# Patient Record
Sex: Female | Born: 1975 | Race: White | Hispanic: No | Marital: Married | Smoking: Never smoker
Health system: Southern US, Community
[De-identification: ages and names within clinical notes are randomized; demographics above are authoritative.]

## PROBLEM LIST (undated history)

## (undated) DIAGNOSIS — C801 Malignant (primary) neoplasm, unspecified: Secondary | ICD-10-CM

## (undated) DIAGNOSIS — Z87898 Personal history of other specified conditions: Secondary | ICD-10-CM

## (undated) DIAGNOSIS — N838 Other noninflammatory disorders of ovary, fallopian tube and broad ligament: Secondary | ICD-10-CM

## (undated) DIAGNOSIS — I7300 Raynaud's syndrome without gangrene: Secondary | ICD-10-CM

## (undated) DIAGNOSIS — Z862 Personal history of diseases of the blood and blood-forming organs and certain disorders involving the immune mechanism: Secondary | ICD-10-CM

## (undated) DIAGNOSIS — Z975 Presence of (intrauterine) contraceptive device: Secondary | ICD-10-CM

## (undated) DIAGNOSIS — R569 Unspecified convulsions: Secondary | ICD-10-CM

## (undated) DIAGNOSIS — R001 Bradycardia, unspecified: Secondary | ICD-10-CM

## (undated) DIAGNOSIS — Z8619 Personal history of other infectious and parasitic diseases: Secondary | ICD-10-CM

## (undated) DIAGNOSIS — I493 Ventricular premature depolarization: Secondary | ICD-10-CM

## (undated) DIAGNOSIS — D649 Anemia, unspecified: Secondary | ICD-10-CM

## (undated) HISTORY — DX: Unspecified convulsions: R56.9

## (undated) HISTORY — DX: Personal history of other infectious and parasitic diseases: Z86.19

## (undated) HISTORY — DX: Personal history of other specified conditions: Z87.898

## (undated) HISTORY — PX: APPENDECTOMY: SHX54

## (undated) HISTORY — PX: ABDOMINAL HYSTERECTOMY: SHX81

## (undated) HISTORY — DX: Malignant (primary) neoplasm, unspecified: C80.1

## (undated) HISTORY — DX: Anemia, unspecified: D64.9

---

## 2005-10-31 HISTORY — PX: COLPOSCOPY: SHX161

## 2013-04-03 LAB — HM PAP SMEAR: HM Pap smear: NEGATIVE

## 2019-04-01 ENCOUNTER — Other Ambulatory Visit: Payer: Self-pay

## 2019-04-01 ENCOUNTER — Encounter: Payer: Self-pay | Admitting: Family Medicine

## 2019-04-01 ENCOUNTER — Ambulatory Visit (INDEPENDENT_AMBULATORY_CARE_PROVIDER_SITE_OTHER): Payer: Managed Care, Other (non HMO) | Admitting: Family Medicine

## 2019-04-01 VITALS — BP 112/72 | HR 50 | Temp 98.9°F | Resp 12 | Ht 65.75 in | Wt 130.5 lb

## 2019-04-01 DIAGNOSIS — S46911A Strain of unspecified muscle, fascia and tendon at shoulder and upper arm level, right arm, initial encounter: Secondary | ICD-10-CM | POA: Insufficient documentation

## 2019-04-01 DIAGNOSIS — Z975 Presence of (intrauterine) contraceptive device: Secondary | ICD-10-CM | POA: Diagnosis not present

## 2019-04-01 DIAGNOSIS — L309 Dermatitis, unspecified: Secondary | ICD-10-CM

## 2019-04-01 NOTE — Progress Notes (Signed)
Subjective:     Kelly Woods is a 43 y.o. female presenting for Establish Care (previous PCP with [place] office-LOV was around 2015) and Rash (symptoms present since the end of last week-03/25/2019 pain in the upper back/shoulder area and pain and rash present in the right arm.)     HPI   #Rash - noticed a rash initially on the lower back in December - resolved on its own and never saw anyone - started with numbness and then rash - last week started having pain behind the shoulder on the right side - thought initially stress on the back - then a couple of days later the pain started shooting down the arm and was getting rashes - was getting an itchy lesions - still having posterior shoulder pain - cannot figure out anything that makes the pain worse - improves with stretching - First lesion on 03/28/2019 and then noticed more over the weekend - has used hydrocortisone cream on the rash - has used cooling towel which helps   #colposcopy - in 2008 - has had normal paps since then   Review of Systems  Constitutional: Negative for chills and fever.  Musculoskeletal:       Shoulder pain  Neurological: Negative for numbness.     Social History   Tobacco Use  Smoking Status Never Smoker  Smokeless Tobacco Never Used        Objective:    BP Readings from Last 3 Encounters:  04/01/19 112/72   Wt Readings from Last 3 Encounters:  04/01/19 130 lb 8 oz (59.2 kg)    BP 112/72   Pulse (!) 50   Temp 98.9 F (37.2 C)   Resp 12   Ht 5' 5.75" (1.67 m)   Wt 130 lb 8 oz (59.2 kg)   SpO2 98%   BMI 21.22 kg/m    Physical Exam Constitutional:      General: She is not in acute distress.    Appearance: Normal appearance. She is well-developed. She is not ill-appearing or diaphoretic.  HENT:     Head: Normocephalic and atraumatic.     Right Ear: External ear normal.     Left Ear: External ear normal.     Nose: Nose normal.  Eyes:     Conjunctiva/sclera:  Conjunctivae normal.  Neck:     Musculoskeletal: Neck supple.  Cardiovascular:     Rate and Rhythm: Regular rhythm. Bradycardia present.     Heart sounds: No murmur.  Pulmonary:     Effort: Pulmonary effort is normal. No respiratory distress.     Breath sounds: Normal breath sounds. No wheezing.  Musculoskeletal:     Comments: Right shoulder:  Inspection: no rashes, lesions Palpation: no TTP, though pt notes discomfort is located lateral to the shoulder blade ROM: Normal Strength: normal with normal rotator cuff testing  Skin:    General: Skin is warm and dry.     Capillary Refill: Capillary refill takes less than 2 seconds.     Comments: Right UE with 3 scattered pustular lesions w/o active draining  Neurological:     Mental Status: She is alert. Mental status is at baseline.  Psychiatric:        Mood and Affect: Mood normal.        Behavior: Behavior normal.           Assessment & Plan:   Problem List Items Addressed This Visit      Musculoskeletal and Integument   Strain  of right shoulder - Primary    Home stretching routine, if not improved - can do PT referral      Dermatitis    Suspect contact/allergic. Advised hydrocortisone cream.         Other   IUD (intrauterine device) in place    Placed ~5 years ago. Mirena          Return if symptoms worsen or fail to improve.  Lynnda ChildJessica R , MD

## 2019-04-01 NOTE — Assessment & Plan Note (Signed)
Home stretching routine, if not improved - can do PT referral

## 2019-04-01 NOTE — Patient Instructions (Addendum)
#  Shoulder pain - would recommend doing stretches - if worsening or not improving, let me know   # Rash - looks like a dermatitis (like poison ivy) - Calamine lotion or hydrocortisone as needed - if worsening or not improving over the next 1-2 weeks let me know  Blood work - try to bring a copy of your blood work to your next visit so we can have it on file  #Mirena  - We will need to order the IUD, and someone will call you to schedule as soon as the IUD arrives.

## 2019-04-01 NOTE — Assessment & Plan Note (Signed)
Suspect contact/allergic. Advised hydrocortisone cream.

## 2019-04-01 NOTE — Assessment & Plan Note (Signed)
Placed ~5 years ago. Mirena

## 2019-04-24 ENCOUNTER — Telehealth: Payer: Self-pay | Admitting: Family Medicine

## 2019-04-24 DIAGNOSIS — Z30433 Encounter for removal and reinsertion of intrauterine contraceptive device: Secondary | ICD-10-CM

## 2019-04-24 NOTE — Addendum Note (Signed)
Addended by: Lesleigh Noe on: 04/24/2019 02:47 PM   Modules accepted: Orders

## 2019-04-24 NOTE — Telephone Encounter (Signed)
At this point we are unfortunately waiting for some supplies to be able to do the procedure.   I'm not sure how long that will take.   I can place a referral to OB/GYN so that she can move forward with the procedure since I know it is time for her replacement.   Lesleigh Noe

## 2019-04-24 NOTE — Telephone Encounter (Signed)
Sending to Dr. Einar Pheasant for review

## 2019-04-24 NOTE — Telephone Encounter (Signed)
Referral placed due to delay obtaining supplies to be able to do the procedure in office.

## 2019-04-24 NOTE — Telephone Encounter (Signed)
Patient advised. Patient would like to proceed with the referral to OBGYN. She would like to go to the Center for [facility] in [place] or [place] location.

## 2019-04-24 NOTE — Telephone Encounter (Signed)
Patient called today to check status of Mirena order. She called to see if this has arrived and to schedule an appointment for this to be placed.    Patient's C/B # - [phone]

## 2019-05-01 ENCOUNTER — Encounter: Payer: Self-pay | Admitting: Advanced Practice Midwife

## 2019-05-01 ENCOUNTER — Other Ambulatory Visit: Payer: Self-pay

## 2019-05-01 ENCOUNTER — Ambulatory Visit (INDEPENDENT_AMBULATORY_CARE_PROVIDER_SITE_OTHER): Payer: Managed Care, Other (non HMO) | Admitting: Advanced Practice Midwife

## 2019-05-01 VITALS — BP 113/71 | HR 71 | Wt 131.4 lb

## 2019-05-01 DIAGNOSIS — Z30433 Encounter for removal and reinsertion of intrauterine contraceptive device: Secondary | ICD-10-CM

## 2019-05-01 DIAGNOSIS — Z124 Encounter for screening for malignant neoplasm of cervix: Secondary | ICD-10-CM

## 2019-05-01 DIAGNOSIS — Z3202 Encounter for pregnancy test, result negative: Secondary | ICD-10-CM

## 2019-05-01 DIAGNOSIS — Z01419 Encounter for gynecological examination (general) (routine) without abnormal findings: Secondary | ICD-10-CM

## 2019-05-01 DIAGNOSIS — Z1151 Encounter for screening for human papillomavirus (HPV): Secondary | ICD-10-CM | POA: Diagnosis not present

## 2019-05-01 DIAGNOSIS — Z975 Presence of (intrauterine) contraceptive device: Secondary | ICD-10-CM

## 2019-05-01 LAB — POCT URINE PREGNANCY: Preg Test, Ur: NEGATIVE

## 2019-05-01 MED ORDER — LEVONORGESTREL 20 MCG/24HR IU IUD
INTRAUTERINE_SYSTEM | Freq: Once | INTRAUTERINE | Status: AC
  Administered 2019-05-01: 10:00:00 via INTRAUTERINE

## 2019-05-01 NOTE — Progress Notes (Signed)
GYNECOLOGY ANNUAL PREVENTATIVE CARE ENCOUNTER NOTE  History:     Kelly Woods is a 43 y.o.  female here for a routine annual gynecologic exam.  Current complaints: none.   Denies abnormal vaginal bleeding, discharge, pelvic pain, problems with intercourse or other gynecologic concerns.    Patient is satisfied with her IUD and requests removal and replacement. She lives with her husband and two children. She is a middle distance runner, is a non-smoker, denies thoughts of self-harm, harm to others. Denies concern for intimate partner violence.   Gynecologic History No LMP recorded. (Menstrual status: IUD). Contraception: IUD Last Pap: 03/2013. Results were: normal with negative HPV Last mammogram: N/A age 10 on 05/10/18.   Obstetric History OB History  No obstetric history on file.    Past Medical History:  Diagnosis Date   History of chicken pox    History of seizure    as a child    Past Surgical History:  Procedure Laterality Date   COLPOSCOPY  2007    Current Outpatient Medications on File Prior to Visit  Medication Sig Dispense Refill   Levonorgestrel (MIRENA, 52 MG, IU) by Intrauterine route.     Multiple Vitamins-Minerals (MULTIVITAMIN ADULT PO) Take by mouth.     No current facility-administered medications on file prior to visit.     No Known Allergies  Social History:  reports that she has never smoked. She has never used smokeless tobacco. She reports current alcohol use. She reports that she does not use drugs.  Family History  Problem Relation Age of Onset   Psoriasis Mother    Sleep apnea Mother    Hyperlipidemia Father    Heart disease Maternal Grandfather        by-pass surgery   Diabetes Paternal Aunt    Breast cancer Paternal Aunt     The following portions of the patient's history were reviewed and updated as appropriate: allergies, current medications, past family history, past medical history, past social history, past  surgical history and problem list.  Review of Systems Pertinent items noted in HPI and remainder of comprehensive ROS otherwise negative.  Physical Exam:  BP 113/71    Pulse 71    Wt 131 lb 6.4 oz (59.6 kg)    BMI 21.37 kg/m  CONSTITUTIONAL: Well-developed, well-nourished female in no acute distress.  HENT:  Normocephalic, atraumatic, External right and left ear normal. Oropharynx is clear and moist EYES: Conjunctivae and EOM are normal. Pupils are equal, round, and reactive to light. No scleral icterus.  NECK: Normal range of motion, supple, no masses.  Normal thyroid.  SKIN: Skin is warm and dry. No rash noted. Not diaphoretic. No erythema. No pallor. MUSCULOSKELETAL: Normal range of motion. No tenderness.  No cyanosis, clubbing, or edema.  2+ distal pulses. NEUROLOGIC: Alert and oriented to person, place, and time. Normal reflexes, muscle tone coordination. No cranial nerve deficit noted. PSYCHIATRIC: Normal mood and affect. Normal behavior. Normal judgment and thought content. CARDIOVASCULAR: Normal heart rate noted, regular rhythm RESPIRATORY: Clear to auscultation bilaterally. Effort and breath sounds normal, no problems with respiration noted. BREASTS: Symmetric in size. No masses, skin changes, nipple drainage, or lymphadenopathy. ABDOMEN: Soft, normal bowel sounds, no distention noted.  No tenderness, rebound or guarding.  PELVIC: Normal appearing external genitalia; normal appearing vaginal mucosa and cervix.  No abnormal discharge noted.  Pap smear obtained.  Normal uterine size, no other palpable masses, no uterine or adnexal tenderness.    IUD Removal and Reinsertion  Patient identified, informed consent performed, consent signed.   Discussed risks of irregular bleeding, cramping, infection, malpositioning or misplacement of the IUD outside the uterus which may require further procedures. Also advised to use backup contraception for one week as the risk of pregnancy is higher  during the transition period of removing an IUD and replacing it with another one. Time out was performed. Speculum placed in the vagina. The strings of the IUD were grasped and pulled using ring forceps. The IUD was successfully removed in its entirety. The cervix was cleaned with Betadine x 2 and grasped anteriorly with a single tooth tenaculum.  The new Mirena IUD insertion apparatus was used to sound the uterus to 7 cm;  the IUD was then placed per manufacturer's recommendations. Strings trimmed to 3 cm. Tenaculum was removed, good hemostasis noted. Patient tolerated procedure well.   Patient was given post-procedure instructions.  She was reminded to have backup contraception for one week during this transition period between IUDs.  Patient was also asked to check IUD strings periodically and follow up in 4 weeks for IUD check.  Assessment and Plan:    1. Encounter for IUD removal and reinsertion - Tolerated well by patient. Aftercare instructions reviewed - POCT urine pregnancy  2. Well woman exam with routine gynecological exam - No concerning findings on physical exam - Lab work accomplished through employer, followed by PCP, not ordered today - Cytology - PAP( ) - MM DIAG BREAST TOMO BILATERAL; Future  Will follow up results of pap smear and manage accordingly. Mammogram ordered Routine preventative health maintenance measures emphasized. Please refer to After Visit Summary for other counseling recommendations.      Return in about 4 weeks (around 05/29/2019) for string check.  Total visit time 30 minutes. 50% of total visit time spent in counseling and coordination of care  Clayton BiblesSamantha  Certified Nurse Midwife [facility] [place] [facility]

## 2019-05-01 NOTE — Progress Notes (Signed)
Needs Mammogram

## 2019-05-01 NOTE — Patient Instructions (Signed)

## 2019-05-06 LAB — CYTOLOGY - PAP
Diagnosis: NEGATIVE
HPV: NOT DETECTED

## 2019-06-05 ENCOUNTER — Other Ambulatory Visit: Payer: Self-pay

## 2019-06-05 ENCOUNTER — Ambulatory Visit (INDEPENDENT_AMBULATORY_CARE_PROVIDER_SITE_OTHER): Payer: Managed Care, Other (non HMO) | Admitting: Advanced Practice Midwife

## 2019-06-05 ENCOUNTER — Encounter: Payer: Self-pay | Admitting: Advanced Practice Midwife

## 2019-06-05 VITALS — BP 107/69 | HR 54

## 2019-06-05 DIAGNOSIS — Z975 Presence of (intrauterine) contraceptive device: Secondary | ICD-10-CM

## 2019-06-05 DIAGNOSIS — Z30431 Encounter for routine checking of intrauterine contraceptive device: Secondary | ICD-10-CM

## 2019-06-05 NOTE — Progress Notes (Signed)
    GYNECOLOGY OFFICE ENCOUNTER NOTE  History:  43 y.o. [id number] here today for today for IUD string check; Mirena  IUD was placed 05/01/19. No complaints about the IUD, no concerning side effects.  The following portions of the patient's history were reviewed and updated as appropriate: allergies, current medications, past family history, past medical history, past social history, past surgical history and problem list. Last pap smear on 05/01/19 was normal, negative HRHPV.  Review of Systems:  Pertinent items are noted in HPI.   Objective:  Physical Exam Blood pressure 107/69, pulse (!) 54. CONSTITUTIONAL: Well-developed, well-nourished female in no acute distress.  HENT:  Normocephalic, atraumatic. External right and left ear normal. Oropharynx is clear and moist EYES: Conjunctivae and EOM are normal. Pupils are equal, round, and reactive to light. No scleral icterus.  NECK: Normal range of motion, supple, no masses CARDIOVASCULAR: Normal heart rate noted RESPIRATORY: Effort and breath sounds normal, no problems with respiration noted ABDOMEN: Soft, no distention noted.   PELVIC: Normal appearing external genitalia; normal appearing vaginal mucosa and cervix.  IUD strings visualized, about 3 cm in length outside cervix.   Assessment & Plan:  Patient to keep IUD in place for up to five years; can come in for removal if she desires pregnancy earlier or for any concerning side effects.  Total visit time 15 minutes. Greater than 50% of visit spent in counseling and coordination of care  Mallie Snooks, MSN, CNM Certified Nurse Midwife, [employer] for [employer], [place]

## 2019-06-10 ENCOUNTER — Other Ambulatory Visit: Payer: Self-pay | Admitting: Advanced Practice Midwife

## 2019-06-10 ENCOUNTER — Ambulatory Visit
Admission: RE | Admit: 2019-06-10 | Discharge: 2019-06-10 | Disposition: A | Discharge status: Home / Self Care | Payer: Managed Care, Other (non HMO) | Source: Ambulatory Visit | Attending: Advanced Practice Midwife | Admitting: Advanced Practice Midwife

## 2019-06-10 ENCOUNTER — Other Ambulatory Visit: Payer: Self-pay

## 2019-06-10 DIAGNOSIS — Z1231 Encounter for screening mammogram for malignant neoplasm of breast: Secondary | ICD-10-CM | POA: Insufficient documentation

## 2019-06-10 DIAGNOSIS — R928 Other abnormal and inconclusive findings on diagnostic imaging of breast: Secondary | ICD-10-CM

## 2019-06-10 DIAGNOSIS — N6489 Other specified disorders of breast: Secondary | ICD-10-CM

## 2019-06-10 DIAGNOSIS — Z01419 Encounter for gynecological examination (general) (routine) without abnormal findings: Secondary | ICD-10-CM

## 2019-06-21 ENCOUNTER — Ambulatory Visit
Admission: RE | Admit: 2019-06-21 | Discharge: 2019-06-21 | Disposition: A | Discharge status: Home / Self Care | Payer: Managed Care, Other (non HMO) | Source: Ambulatory Visit | Attending: Advanced Practice Midwife | Admitting: Advanced Practice Midwife

## 2019-06-21 DIAGNOSIS — N6489 Other specified disorders of breast: Secondary | ICD-10-CM | POA: Diagnosis present

## 2019-06-21 DIAGNOSIS — R928 Other abnormal and inconclusive findings on diagnostic imaging of breast: Secondary | ICD-10-CM

## 2019-11-05 ENCOUNTER — Encounter: Payer: Self-pay | Admitting: Internal Medicine

## 2019-11-05 ENCOUNTER — Ambulatory Visit: Payer: Managed Care, Other (non HMO) | Admitting: Internal Medicine

## 2019-11-05 ENCOUNTER — Other Ambulatory Visit: Payer: Self-pay

## 2019-11-05 VITALS — BP 110/70 | HR 67 | Temp 97.9°F | Wt 132.0 lb

## 2019-11-05 DIAGNOSIS — H9202 Otalgia, left ear: Secondary | ICD-10-CM

## 2019-11-05 NOTE — Patient Instructions (Signed)

## 2019-11-05 NOTE — Progress Notes (Signed)
Subjective:    Patient ID: Kelly Woods, female    DOB: 04-Nov-1975, 44 y.o.   MRN: [id number]  HPI  Pt presents to the clinic today with c/o left ear pain. This started 2 months ago. It is intermittent. She also has a slight aching in her left ear but denies ear fullness, discharge or loss of hearing. She denies headaches, dizziness, runny nose, nasal congestion, ear pain, sore throat or cough. She denies any trauma to the area. She has not tried any medication OTC.  Review of Systems  Past Medical History:  Diagnosis Date  . History of chicken pox   . History of seizure    as a child    Current Outpatient Medications  Medication Sig Dispense Refill  . Levonorgestrel (MIRENA, 52 MG, IU) by Intrauterine route.    . Multiple Vitamins-Minerals (MULTIVITAMIN ADULT PO) Take by mouth.     No current facility-administered medications for this visit.    No Known Allergies  Family History  Problem Relation Age of Onset  . Psoriasis Mother   . Sleep apnea Mother   . Hyperlipidemia Father   . Heart disease Maternal Grandfather        by-pass surgery  . Diabetes Paternal Aunt   . Breast cancer Paternal Aunt        78's?    Social History   Socioeconomic History  . Marital status: Married    Spouse name: Jonne Ply)  . Number of children: 2  . Years of education: Bachelors degree  . Highest education level: Not on file  Occupational History  . Not on file  Tobacco Use  . Smoking status: Never Smoker  . Smokeless tobacco: Never Used  Substance and Sexual Activity  . Alcohol use: Yes    Comment: 2 drinks a week or so  . Drug use: Never  . Sexual activity: Yes    Birth control/protection: I.U.D.    Comment: IUD - about 5 years ago  Other Topics Concern  . Not on file  Social History Narrative   04/01/19   From: [place] originally    Living: with husband Jeneen Rinks and 2 kids   Work: [employer]      Family: 2 kids - Actor and Ava      Enjoys: running - distance up  to 1/2 marathon, hiking, geocashing       Exercise: running   Diet: cooks at home, reasonably well - overall doing well      Safety   Seat belts: Yes    Guns: No   Safe in relationships: Yes    Social Determinants of Radio broadcast assistant Strain: Low Risk   . Difficulty of Paying Living Expenses: Not hard at all  Food Insecurity:   . Worried About Charity fundraiser in the Last Year: Not on file  . Ran Out of Food in the Last Year: Not on file  Transportation Needs:   . Lack of Transportation (Medical): Not on file  . Lack of Transportation (Non-Medical): Not on file  Physical Activity:   . Days of Exercise per Week: Not on file  . Minutes of Exercise per Session: Not on file  Stress:   . Feeling of Stress : Not on file  Social Connections:   . Frequency of Communication with Friends and Family: Not on file  . Frequency of Social Gatherings with Friends and Family: Not on file  . Attends Religious Services: Not on file  .  Active Member of Clubs or Organizations: Not on file  . Attends Banker Meetings: Not on file  . Marital Status: Not on file  Intimate Partner Violence:   . Fear of Current or Ex-Partner: Not on file  . Emotionally Abused: Not on file  . Physically Abused: Not on file  . Sexually Abused: Not on file     Constitutional: Denies fever, malaise, fatigue, headache or abrupt weight changes.  HEENT: Pt reports left ear pain. Denies eye pain, eye redness, ringing in the ears, wax buildup, runny nose, nasal congestion, bloody nose, or sore throat. Respiratory: Denies difficulty breathing, shortness of breath, cough or sputum production.   Cardiovascular: Denies chest pain, chest tightness, palpitations or swelling in the hands or feet.   No other specific complaints in a complete review of systems (except as listed in HPI above).     Objective:   Physical Exam BP 110/70   Pulse 67   Temp 97.9 F (36.6 C) (Temporal)   Wt 132 lb (59.9  kg)   SpO2 98%   BMI 21.47 kg/m   Wt Readings from Last 3 Encounters:  05/01/19 131 lb 6.4 oz (59.6 kg)  04/01/19 130 lb 8 oz (59.2 kg)    General: Appears her stated age, well developed, well nourished in NAD. HEENT: Head: normal shape and size; Ears: Tm's gray and intact, normal light reflex;  Neck:  Neck supple, trachea midline. No masses, lumps or thyromegaly present.  Cardiovascular: Normal rate and rhythm. S1,S2 noted.  No murmur, rubs or gallops noted.  Pulmonary/Chest: Normal effort and positive vesicular breath sounds. No respiratory distress. No wheezes, rales or ronchi noted.  Neurological: Alert and oriented.         Assessment & Plan:   Otalgia, Left:  Exam benign Can try Flonase 1 spray BID x 7 days If no improvement, follow up with PCP Consider ENT referral if symptoms persist  Return precautions discussed Nicki Reaper, NP This visit occurred during the SARS-CoV-2 public health emergency.  Safety protocols were in place, including screening questions prior to the visit, additional usage of staff PPE, and extensive cleaning of exam room while observing appropriate contact time as indicated for disinfecting solutions.

## 2020-01-24 ENCOUNTER — Ambulatory Visit: Payer: Managed Care, Other (non HMO) | Attending: Internal Medicine

## 2020-01-24 DIAGNOSIS — Z23 Encounter for immunization: Secondary | ICD-10-CM

## 2020-01-24 NOTE — Progress Notes (Signed)
   Covid-19 Vaccination Clinic  Name:  Kelly Woods    MRN: [id number] DOB: 01-25-1976  01/24/2020  [name] was observed post Covid-19 immunization for 15 minutes without incident. She was provided with Vaccine Information Sheet and instruction to access the V-Safe system.   [name] was instructed to call 911 with any severe reactions post vaccine: Marland Kitchen Difficulty breathing  . Swelling of face and throat  . A fast heartbeat  . A bad rash all over body  . Dizziness and weakness   Immunizations Administered    Name Date Dose VIS Date Route   Moderna COVID-19 Vaccine 01/24/2020  9:31 AM 0.5 mL 10/01/2019 Intramuscular   Manufacturer: Moderna   Lot: [id number]   NDC: 24097-353-29

## 2020-02-26 ENCOUNTER — Ambulatory Visit: Payer: Managed Care, Other (non HMO) | Attending: Internal Medicine

## 2020-02-26 DIAGNOSIS — Z23 Encounter for immunization: Secondary | ICD-10-CM

## 2020-02-26 NOTE — Progress Notes (Signed)
   Covid-19 Vaccination Clinic  Name:  Kelly Woods    MRN: [id number] DOB: October 18, 1976  02/26/2020  Kelly Woods was observed post Covid-19 immunization for 15 minutes without incident. She was provided with Vaccine Information Sheet and instruction to access the V-Safe system.   Kelly Woods was instructed to call 911 with any severe reactions post vaccine: Marland Kitchen Difficulty breathing  . Swelling of face and throat  . A fast heartbeat  . A bad rash all over body  . Dizziness and weakness   Immunizations Administered    Name Date Dose VIS Date Route   Moderna COVID-19 Vaccine 02/26/2020  9:33 AM 0.5 mL 10/2019 Intramuscular   Manufacturer: Moderna   Lot: [id number]   NDC: 09794-997-18

## 2020-06-19 IMAGING — MG DIGITAL SCREENING BILATERAL MAMMOGRAM WITH TOMO AND CAD
8 series · 9 of 24 positions shown · non-contrast
Comparison: None.

CLINICAL DATA: Screening.

EXAM:
DIGITAL SCREENING BILATERAL MAMMOGRAM WITH TOMO AND CAD

[R CC synth-2D]
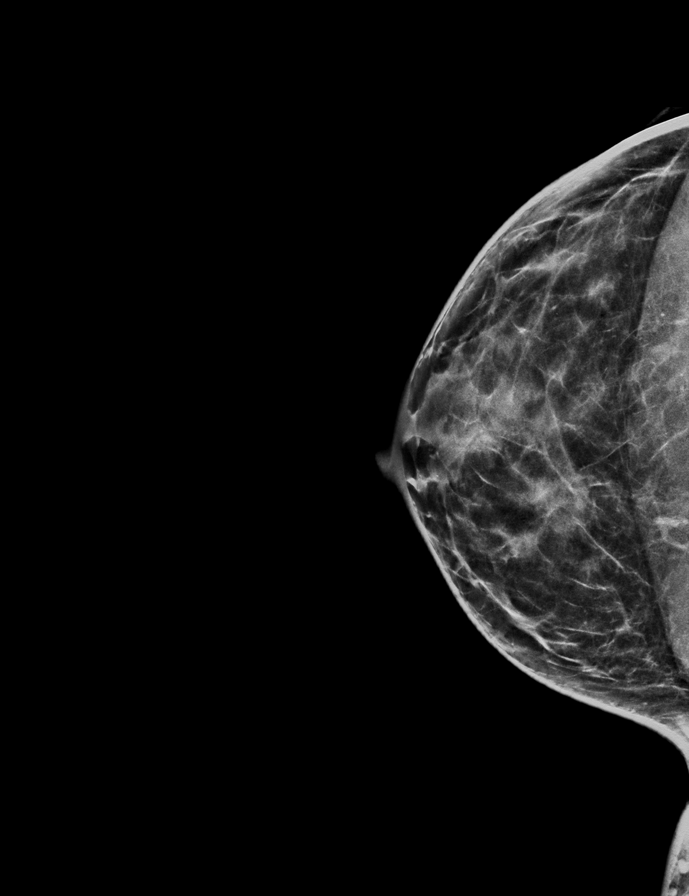

[L CC synth-2D]
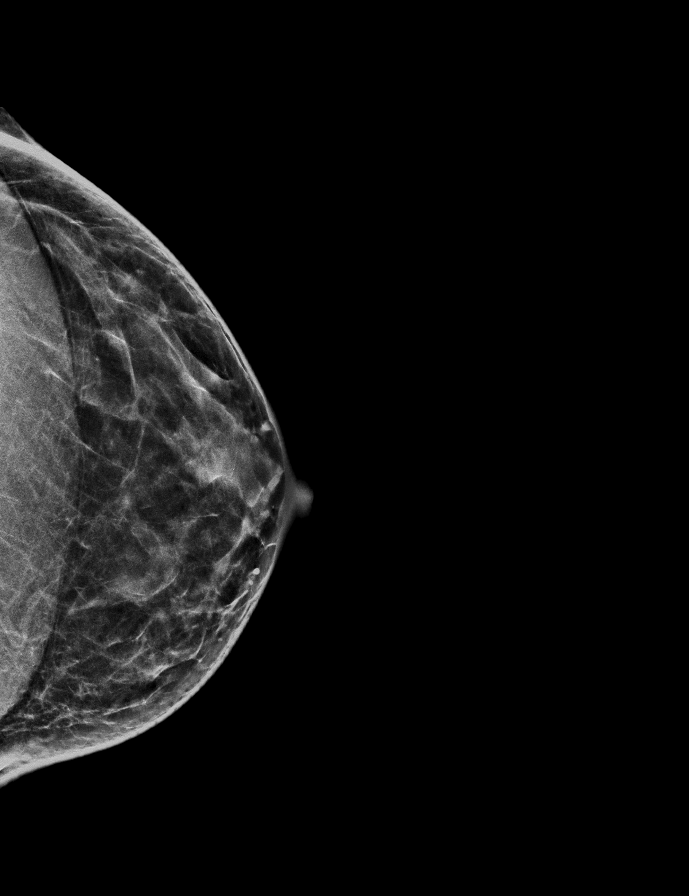

[L MLO synth-2D]
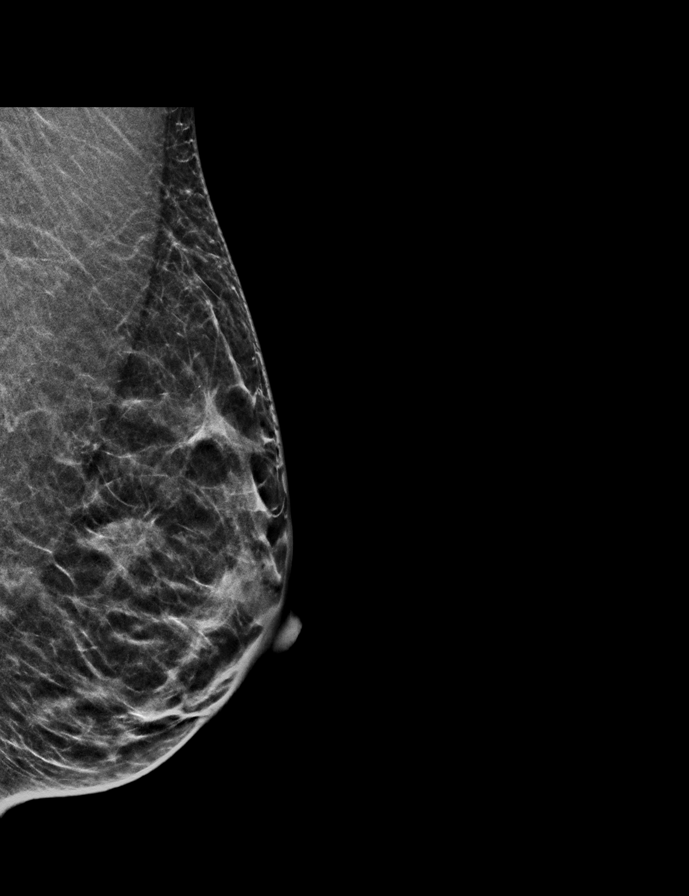

[R MLO synth-2D]
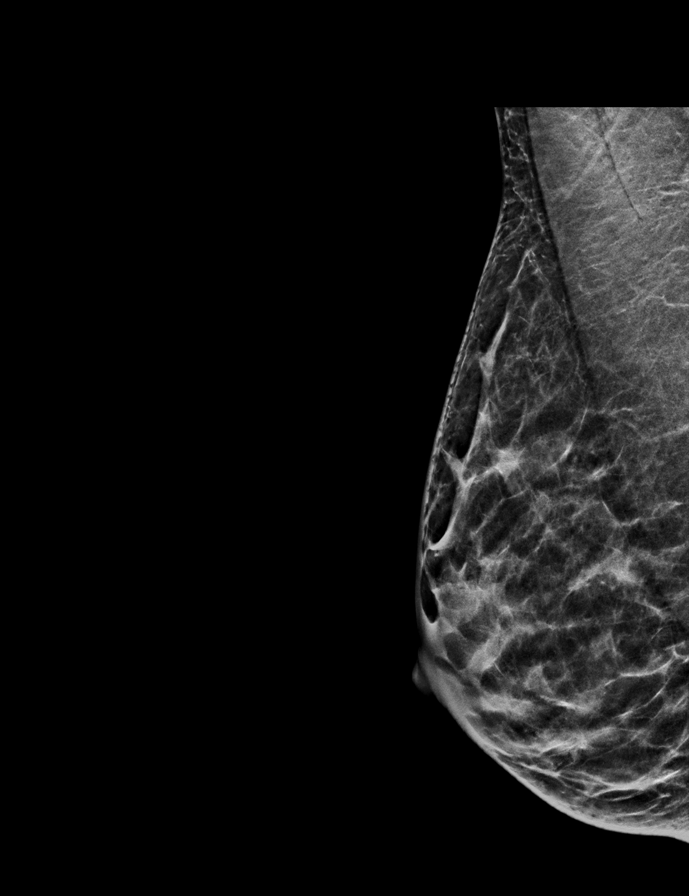

[R MLO tomo · 2 of 47 frames shown]
[frame 16/47]
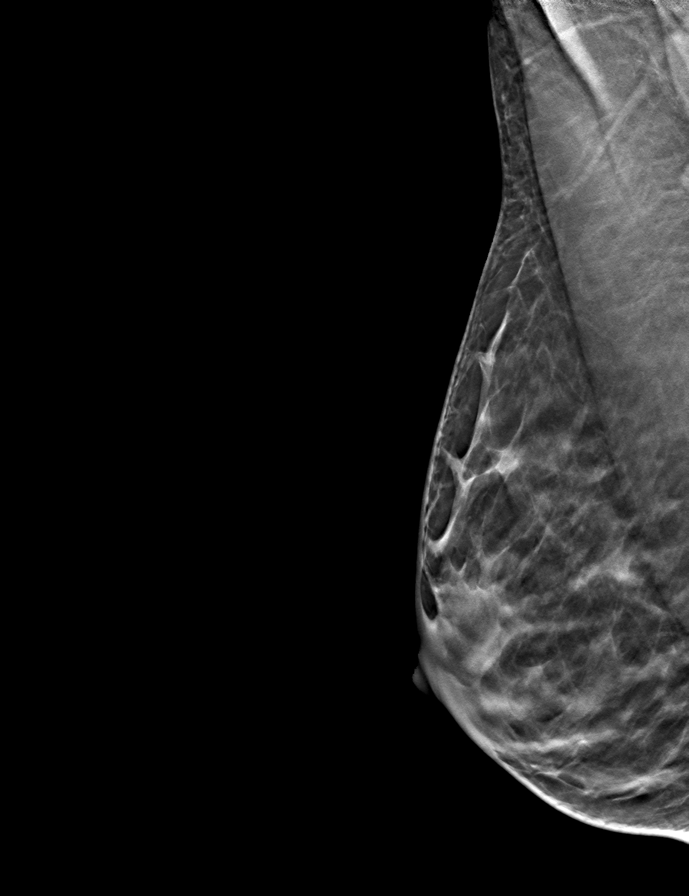
[frame 24/47]
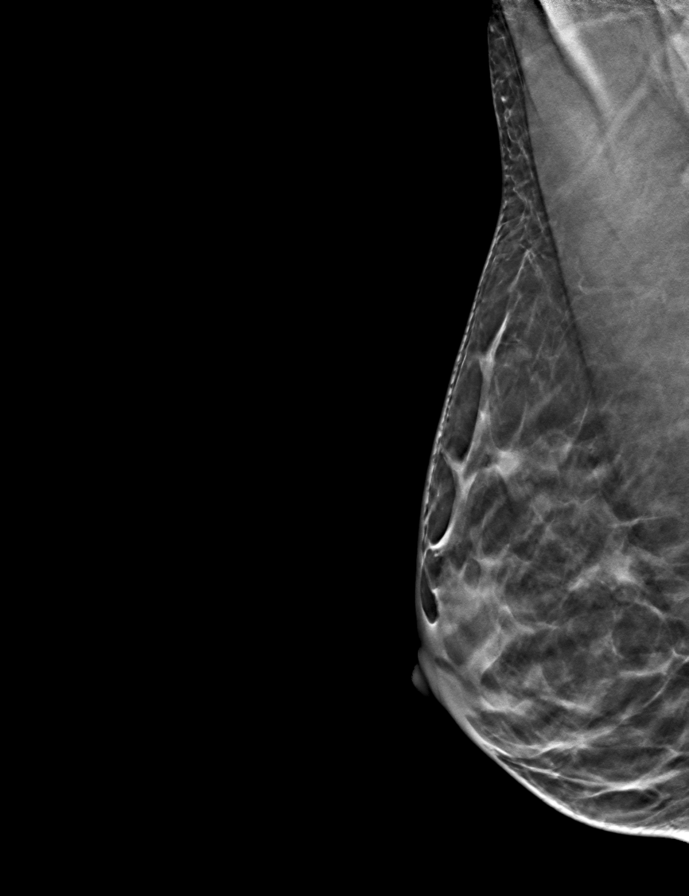

[R CC tomo · tomo slice 27/54.0]
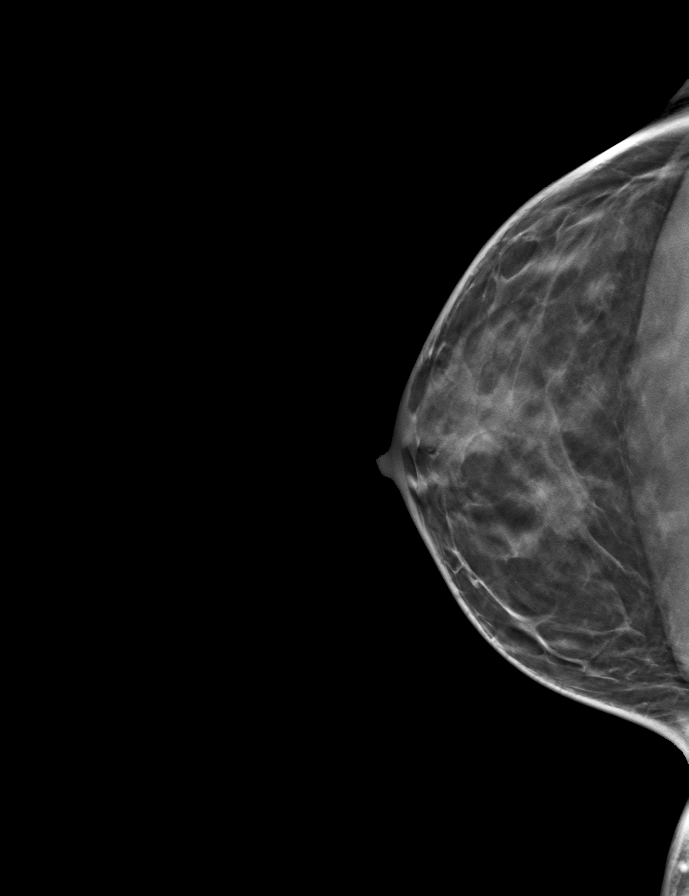

[L CC tomo · tomo slice 27/52.0]
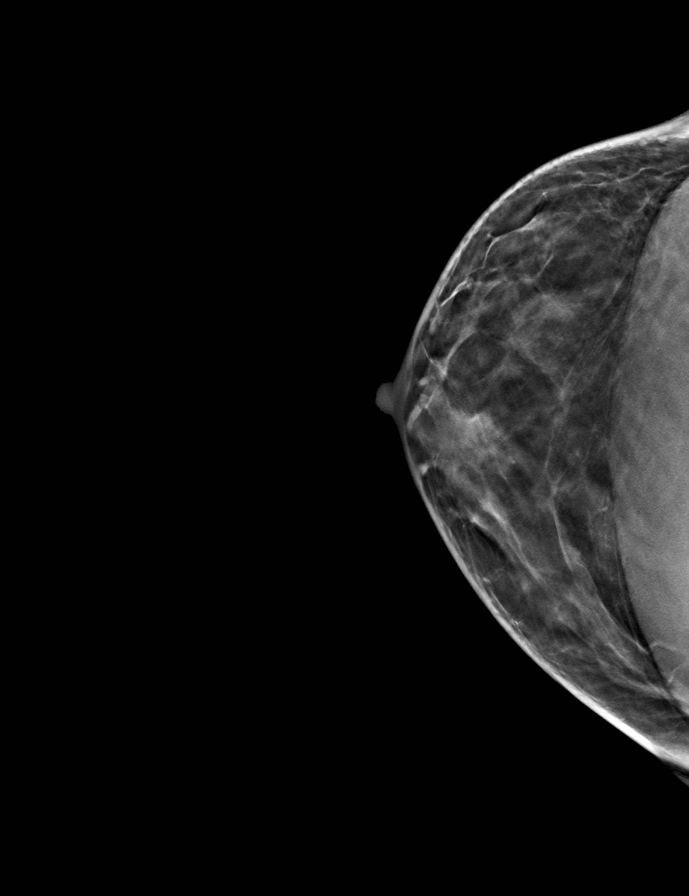

[L MLO tomo · tomo slice 23/44.0]
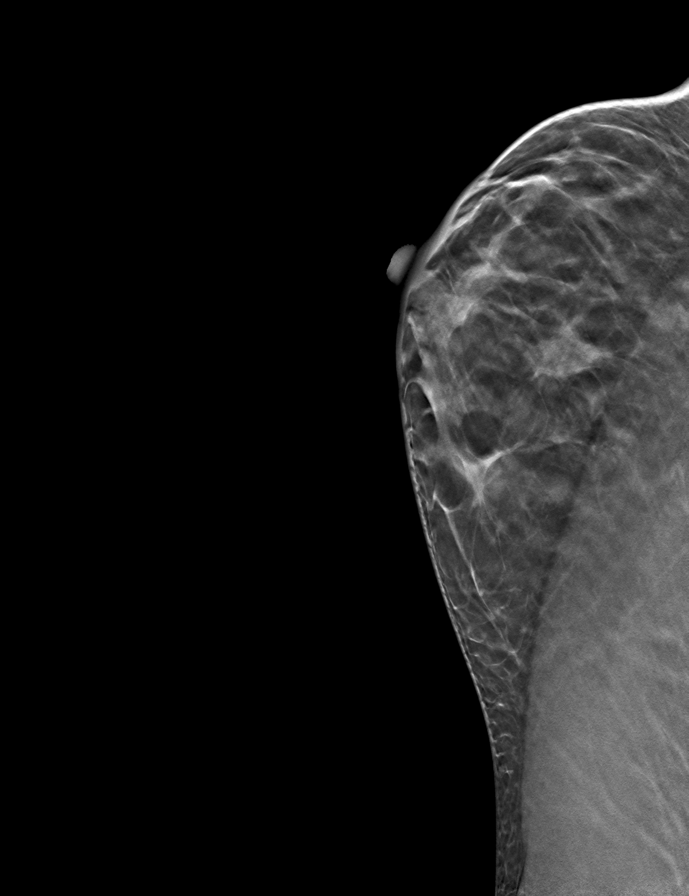

[9 of 24 positions shown; findings below may reference images not displayed]

ACR Breast Density Category c: The breast tissue is heterogeneously
dense, which may obscure small masses.
FINDINGS: In the left breast, a possible asymmetry warrants further
evaluation. In the right breast, no findings suspicious for
malignancy. Images were processed with CAD.
IMPRESSION: Further evaluation is suggested for possible asymmetry in the left
breast.

RECOMMENDATION:
Diagnostic mammogram and possibly ultrasound of the left breast.
(Code:[id number])

The patient will be contacted regarding the findings, and additional
imaging will be scheduled.

BI-RADS CATEGORY  0: Incomplete. Need additional imaging evaluation
and/or prior mammograms for comparison.

## 2021-04-19 ENCOUNTER — Encounter: Payer: Self-pay | Admitting: Family Medicine

## 2021-04-19 ENCOUNTER — Other Ambulatory Visit: Payer: Self-pay

## 2021-04-19 ENCOUNTER — Ambulatory Visit (INDEPENDENT_AMBULATORY_CARE_PROVIDER_SITE_OTHER): Payer: Managed Care, Other (non HMO) | Admitting: Family Medicine

## 2021-04-19 VITALS — BP 120/64 | HR 67 | Temp 98.1°F | Ht 66.0 in | Wt 129.8 lb

## 2021-04-19 DIAGNOSIS — I7300 Raynaud's syndrome without gangrene: Secondary | ICD-10-CM | POA: Insufficient documentation

## 2021-04-19 DIAGNOSIS — Z1159 Encounter for screening for other viral diseases: Secondary | ICD-10-CM

## 2021-04-19 DIAGNOSIS — Z8639 Personal history of other endocrine, nutritional and metabolic disease: Secondary | ICD-10-CM

## 2021-04-19 DIAGNOSIS — Z114 Encounter for screening for human immunodeficiency virus [HIV]: Secondary | ICD-10-CM

## 2021-04-19 DIAGNOSIS — I499 Cardiac arrhythmia, unspecified: Secondary | ICD-10-CM

## 2021-04-19 DIAGNOSIS — Z136 Encounter for screening for cardiovascular disorders: Secondary | ICD-10-CM | POA: Diagnosis not present

## 2021-04-19 DIAGNOSIS — R001 Bradycardia, unspecified: Secondary | ICD-10-CM

## 2021-04-19 NOTE — Patient Instructions (Signed)
Blood work today - monitor symptoms

## 2021-04-19 NOTE — Assessment & Plan Note (Signed)
Discussed likely PVC or PAC but given worsening and persistence and lack of presence on EKG will get labs to rule out other causes and if negative will plan for cardiology referral for extended monitoring.

## 2021-04-19 NOTE — Assessment & Plan Note (Signed)
EKG with sinus bradycardia and some inverted T-waves. Suspect normal in athlete and no symptoms of dizziness/confusion. Watch and wait.

## 2021-04-19 NOTE — Progress Notes (Addendum)
Subjective:     Kelly Woods is a 45 y.o. female presenting for Irregular Heart Beat (Gets stronger and irregular when she lays down) and Circulatory Problem (circulation issues in fingers and toes )     HPI  #irregular heartbeat - worse with laying down - can feel a stronger heartbeat w/o increase in rate - could feel it more with laying down - if she lays on the left side - could feel her heartbeat at that time - couldn't sleep on the left side - no cp or sob - running regularly w/o symptoms - no pounding - normal activities doing well - no anxiety - no dizziness or lightheadedness - now noticing two close together beats every once and while - could happen after only normal beats or may be after several minutes  #fingers/toes - will get numb and turn white - improved in the summer - no joint pain - no chronic fatigue - endorses some pain at the end of the day  Review of Systems   Social History   Tobacco Use  Smoking Status Never  Smokeless Tobacco Never        Objective:    BP Readings from Last 3 Encounters:  04/19/21 120/64  11/05/19 110/70  06/05/19 107/69   Wt Readings from Last 3 Encounters:  04/19/21 129 lb 12 oz (58.9 kg)  11/05/19 132 lb (59.9 kg)  05/01/19 131 lb 6.4 oz (59.6 kg)    BP 120/64   Pulse 67   Temp 98.1 F (36.7 C) (Temporal)   Ht 5\' 6"  (1.676 m)   Wt 129 lb 12 oz (58.9 kg)   SpO2 99%   BMI 20.94 kg/m    Physical Exam Constitutional:      General: She is not in acute distress.    Appearance: She is well-developed. She is not diaphoretic.  HENT:     Right Ear: External ear normal.     Left Ear: External ear normal.     Nose: Nose normal.  Eyes:     Conjunctiva/sclera: Conjunctivae normal.  Cardiovascular:     Rate and Rhythm: Normal rate and regular rhythm.     Heart sounds: No murmur heard. Pulmonary:     Effort: Pulmonary effort is normal. No respiratory distress.     Breath sounds: Normal breath sounds. No  wheezing.  Musculoskeletal:     Cervical back: Neck supple.  Skin:    General: Skin is warm and dry.     Capillary Refill: Capillary refill takes less than 2 seconds.  Neurological:     Mental Status: She is alert. Mental status is at baseline.  Psychiatric:        Mood and Affect: Mood normal.        Behavior: Behavior normal.    EKG: sinus brady rate 49. Twave inversions      Assessment & Plan:   Problem List Items Addressed This Visit       Other   Raynaud phenomenon    Describes what sounds consistent with raynaud's w/o other systemic symptoms. Advised preventative options - gloves, hand warmers - and warning signs if worsening       Relevant Orders   Comprehensive metabolic panel   Irregular heartbeat - Primary    Discussed likely PVC or PAC but given worsening and persistence and lack of presence on EKG will get labs to rule out other causes and if negative will plan for cardiology referral for extended monitoring.  Relevant Orders   EKG 12-Lead (Completed)   CBC   Comprehensive metabolic panel   TSH   Bradycardia    EKG with sinus bradycardia and some inverted T-waves. Suspect normal in athlete and no symptoms of dizziness/confusion. Watch and wait.        Other Visit Diagnoses     Encounter for special screening examination for cardiovascular disorder       Relevant Orders   Lipid panel   Need for hepatitis C screening test       Relevant Orders   Hepatitis C antibody   Encounter for screening for HIV       Relevant Orders   HIV Antibody (routine testing w rflx)   History of iron deficiency       Relevant Orders   Ferritin      Addendum: labs mostly normal. Will refer to cardiology for irregular heartbeat  Return in about 1 year (around 04/19/2022) for annual.  Lynnda Child, MD  This visit occurred during the SARS-CoV-2 public health emergency.  Safety protocols were in place, including screening questions prior to the visit,  additional usage of staff PPE, and extensive cleaning of exam room while observing appropriate contact time as indicated for disinfecting solutions.

## 2021-04-19 NOTE — Assessment & Plan Note (Signed)
Describes what sounds consistent with raynaud's w/o other systemic symptoms. Advised preventative options - gloves, hand warmers - and warning signs if worsening

## 2021-04-20 LAB — CBC
Hematocrit: 40 % (ref 34.0–46.6)
Hemoglobin: 13.1 g/dL (ref 11.1–15.9)
MCH: 28.2 pg (ref 26.6–33.0)
MCHC: 32.8 g/dL (ref 31.5–35.7)
MCV: 86 fL (ref 79–97)
Platelets: 210 10*3/uL (ref 150–450)
RBC: 4.65 x10E6/uL (ref 3.77–5.28)
RDW: 12.4 % (ref 11.7–15.4)
WBC: 6.1 10*3/uL (ref 3.4–10.8)

## 2021-04-20 LAB — LIPID PANEL
Chol/HDL Ratio: 3.6 ratio (ref 0.0–4.4)
Cholesterol, Total: 145 mg/dL (ref 100–199)
HDL: 40 mg/dL (ref >39)
LDL Chol Calc (NIH): 84 mg/dL (ref 0–99)
Triglycerides: 114 mg/dL (ref 0–149)
VLDL Cholesterol Cal: 21 mg/dL (ref 5–40)

## 2021-04-20 LAB — COMPREHENSIVE METABOLIC PANEL
ALT: 36 IU/L — ABNORMAL HIGH (ref 0–32)
AST: 42 IU/L — ABNORMAL HIGH (ref 0–40)
Albumin/Globulin Ratio: 2.3 — ABNORMAL HIGH (ref 1.2–2.2)
Albumin: 5 g/dL — ABNORMAL HIGH (ref 3.8–4.8)
Alkaline Phosphatase: 72 IU/L (ref 44–121)
BUN/Creatinine Ratio: 16 (ref 9–23)
BUN: 14 mg/dL (ref 6–24)
Bilirubin Total: 0.7 mg/dL (ref 0.0–1.2)
CO2: 23 mmol/L (ref 20–29)
Calcium: 9.8 mg/dL (ref 8.7–10.2)
Chloride: 100 mmol/L (ref 96–106)
Creatinine, Ser: 0.87 mg/dL (ref 0.57–1.00)
Globulin, Total: 2.2 g/dL (ref 1.5–4.5)
Glucose: 97 mg/dL (ref 65–99)
Potassium: 4.5 mmol/L (ref 3.5–5.2)
Sodium: 137 mmol/L (ref 134–144)
Total Protein: 7.2 g/dL (ref 6.0–8.5)
eGFR: 85 mL/min/{1.73_m2} (ref >59)

## 2021-04-20 LAB — HEPATITIS C ANTIBODY: Hep C Virus Ab: <0.1 s/co ratio (ref 0.0–0.9)

## 2021-04-20 LAB — TSH: TSH: 3.07 u[IU]/mL (ref 0.450–4.500)

## 2021-04-20 LAB — HIV ANTIBODY (ROUTINE TESTING W REFLEX): HIV Screen 4th Generation wRfx: NONREACTIVE

## 2021-04-20 LAB — FERRITIN: Ferritin: 189 ng/mL — ABNORMAL HIGH (ref 15–150)

## 2021-04-20 NOTE — Addendum Note (Signed)
Addended by: Lynnda Child on: 04/20/2021 08:38 AM   Modules accepted: Orders

## 2021-04-29 ENCOUNTER — Encounter: Payer: Self-pay | Admitting: Cardiology

## 2021-04-29 ENCOUNTER — Ambulatory Visit (INDEPENDENT_AMBULATORY_CARE_PROVIDER_SITE_OTHER): Payer: Managed Care, Other (non HMO)

## 2021-04-29 ENCOUNTER — Other Ambulatory Visit: Payer: Self-pay

## 2021-04-29 ENCOUNTER — Ambulatory Visit: Payer: Managed Care, Other (non HMO) | Admitting: Cardiology

## 2021-04-29 VITALS — BP 110/72 | HR 54 | Ht 66.0 in | Wt 129.0 lb

## 2021-04-29 DIAGNOSIS — I499 Cardiac arrhythmia, unspecified: Secondary | ICD-10-CM

## 2021-04-29 NOTE — Progress Notes (Signed)
Cardiology Office Note:    Date:  04/29/2021   ID:  Kelly Woods, DOB January 07, 1976, MRN [id number]  PCP:  Lynnda Child, MD   [facility] Providers Cardiologist:  None     Referring MD: Lynnda Child, MD   Chief Complaint  Patient presents with   New Patient (Initial Visit)    Referred by PCP for Irregular Heart beat. Meds reviewed verbally with patient.    Kelly Woods is a 45 y.o. female who is being seen today for the evaluation of irregular heartbeats at the request of Selena Batten Chryl Heck, MD.   History of Present Illness:    Kelly Woods is a 45 y.o. female with no significant past medical history who presents due to irregular heartbeats.  Patient has noticed irregular heartbeats over the past 2 weeks.  Symptoms typically occur when she is about to go to bed.  She describes heart rate as occasional prominent, sometimes irregular.  Denies dizziness, shortness of breath, chest pain.  She denies any history or family history of heart disease.  Drinks maybe 1 to 2 cups of coffee daily.  Past Medical History:  Diagnosis Date   History of chicken pox    History of seizure    as a child    Past Surgical History:  Procedure Laterality Date   COLPOSCOPY  2007    Current Medications: Current Meds  Medication Sig   Levonorgestrel (MIRENA, 52 MG, IU) by Intrauterine route.   Multiple Vitamins-Minerals (MULTIVITAMIN ADULT PO) Take by mouth.     Allergies:   Patient has no known allergies.   Social History   Socioeconomic History   Marital status: Married    Spouse name: Fayrene Fearing Rosanne Ashing)   Number of children: 2   Years of education: Bachelors degree   Highest education level: Not on file  Occupational History   Not on file  Tobacco Use   Smoking status: Never   Smokeless tobacco: Never  Vaping Use   Vaping Use: Never used  Substance and Sexual Activity   Alcohol use: Yes    Comment: 2 drinks a week or so   Drug use: Never   Sexual activity: Yes    Birth  control/protection: I.U.D.    Comment: IUD - about 5 years ago  Other Topics Concern   Not on file  Social History Narrative   04/01/19   From: [place] originally    Living: with husband Fayrene Fearing and 2 kids   Work: [employer]      Family: 2 kids - Government social research officer and Ava      Enjoys: running - distance up to 1/2 marathon, hiking, geocashing       Exercise: running   Diet: cooks at home, reasonably well - overall doing well      Safety   Seat belts: Yes    Guns: No   Safe in relationships: Yes    Social Determinants of Corporate investment banker Strain: Not on file  Food Insecurity: Not on file  Transportation Needs: Not on file  Physical Activity: Not on file  Stress: Not on file  Social Connections: Not on file     Family History: The patient's family history includes Breast cancer in her paternal aunt; Diabetes in her paternal aunt; Heart disease in her maternal grandfather; Hyperlipidemia in her father; Psoriasis in her mother; Sleep apnea in her mother.  ROS:   Please see the history of present illness.     All other systems reviewed  and are negative.  EKGs/Labs/Other Studies Reviewed:    The following studies were reviewed today:   EKG:  EKG is  ordered today.  The ekg ordered today demonstrates sinus bradycardia, otherwise normal ECG.  Recent Labs: 04/19/2021: ALT 36; BUN 14; Creatinine, Ser 0.87; Hemoglobin 13.1; Platelets 210; Potassium 4.5; Sodium 137; TSH 3.070  Recent Lipid Panel    Component Value Date/Time   CHOL 145 04/19/2021 1532   TRIG 114 04/19/2021 1532   HDL 40 04/19/2021 1532   CHOLHDL 3.6 04/19/2021 1532   LDLCALC 84 04/19/2021 1532     Risk Assessment/Calculations:          Physical Exam:    VS:  BP 110/72 (BP Location: Left Arm, Patient Position: Sitting, Cuff Size: Normal)   Pulse (!) 54   Ht 5\' 6"  (1.676 m)   Wt 129 lb (58.5 kg)   SpO2 99%   BMI 20.82 kg/m     Wt Readings from Last 3 Encounters:  04/29/21 129 lb (58.5 kg)   04/19/21 129 lb 12 oz (58.9 kg)  11/05/19 132 lb (59.9 kg)     GEN:  Well nourished, well developed in no acute distress HEENT: Normal NECK: No JVD; No carotid bruits LYMPHATICS: No lymphadenopathy CARDIAC: RRR, no murmurs, rubs, gallops RESPIRATORY:  Clear to auscultation without rales, wheezing or rhonchi  ABDOMEN: Soft, non-tender, non-distended MUSCULOSKELETAL:  No edema; No deformity  SKIN: Warm and dry NEUROLOGIC:  Alert and oriented x 3 PSYCHIATRIC:  Normal affect   ASSESSMENT:    1. Irregular heart beat    PLAN:    In order of problems listed above:  Patient with irregular heartbeats, denies chest pain, shortness of breath, dizziness, syncope.  Denies any history of heart disease.  Place cardiac monitor to evaluate any significant arrhythmias.  Symptoms likely from PACs or PVCs.  Follow-up after cardiac monitor.     Medication Adjustments/Labs and Tests Ordered: Current medicines are reviewed at length with the patient today.  Concerns regarding medicines are outlined above.  Orders Placed This Encounter  Procedures   LONG TERM MONITOR (3-14 DAYS)   EKG 12-Lead    No orders of the defined types were placed in this encounter.   Patient Instructions  Medication Instructions:  Your physician recommends that you continue on your current medications as directed. Please refer to the Current Medication list given to you today.  *If you need a refill on your cardiac medications before your next appointment, please call your pharmacy*   Lab Work: None ordered If you have labs (blood work) drawn today and your tests are completely normal, you will receive your results only by: MyChart Message (if you have MyChart) OR A paper copy in the mail If you have any lab test that is abnormal or we need to change your treatment, we will call you to review the results.   Testing/Procedures: Your physician has recommended that you wear a Zio XT monitor. (To be worn for 14  days)  This monitor is a medical device that records the heart's electrical activity. Doctors most often use these monitors to diagnose arrhythmias. Arrhythmias are problems with the speed or rhythm of the heartbeat. The monitor is a small device applied to your chest. You can wear one while you do your normal daily activities. While wearing this monitor if you have any symptoms to push the button and record what you felt. Once you have worn this monitor for the period of time provider prescribed (Usually  14 days), you will return the monitor device in the postage paid box. Once it is returned they will download the data collected and provide [place] with a report which the provider will then review and we will call you with those results. Important tips:  Avoid showering during the first 24 hours of wearing the monitor. Avoid excessive sweating to help maximize wear time. Do not submerge the device, no hot tubs, and no swimming pools. Keep any lotions or oils away from the patch. After 24 hours you may shower with the patch on. Take brief showers with your back facing the shower head.  Do not remove patch once it has been placed because that will interrupt data and decrease adhesive wear time. Push the button when you have any symptoms and write down what you were feeling. Once you have completed wearing your monitor, remove and place into box which has postage paid and place in your outgoing mailbox.  If for some reason you have misplaced your box then call our office and we can provide another box and/or mail it off for you.      Follow-Up: At [facility], you and your health needs are our priority.  As part of our continuing mission to provide you with exceptional heart care, we have created designated Provider Care Teams.  These Care Teams include your primary Cardiologist (physician) and Advanced Practice Providers (APPs -  Physician Assistants and Nurse Practitioners) who all work together to  provide you with the care you need, when you need it.  We recommend signing up for the patient portal called "MyChart".  Sign up information is provided on this After Visit Summary.  MyChart is used to connect with patients for Virtual Visits (Telemedicine).  Patients are able to view lab/test results, encounter notes, upcoming appointments, etc.  Non-urgent messages can be sent to your provider as well.   To learn more about what you can do with MyChart, go to [email or web address].    Your next appointment:   4-5 week(s)  The format for your next appointment:   In Person  Provider:   You may see Debbe Odea or one of the following Advanced Practice Providers on your designated Care Team:   Nicolasa Ducking, NP Eula Listen, PA-C Marisue Ivan, PA-C Cadence [place], [place] Gillian Shields, NP   Other Instructions N/A   Signed, Debbe Odea, MD  04/29/2021 11:50 AM    [facility]

## 2021-04-29 NOTE — Patient Instructions (Signed)
Medication Instructions:  Your physician recommends that you continue on your current medications as directed. Please refer to the Current Medication list given to you today.  *If you need a refill on your cardiac medications before your next appointment, please call your pharmacy*   Lab Work: None ordered If you have labs (blood work) drawn today and your tests are completely normal, you will receive your results only by: MyChart Message (if you have MyChart) OR A paper copy in the mail If you have any lab test that is abnormal or we need to change your treatment, we will call you to review the results.   Testing/Procedures: Your physician has recommended that you wear a Zio XT monitor. (To be worn for 14 days)  This monitor is a medical device that records the heart's electrical activity. Doctors most often use these monitors to diagnose arrhythmias. Arrhythmias are problems with the speed or rhythm of the heartbeat. The monitor is a small device applied to your chest. You can wear one while you do your normal daily activities. While wearing this monitor if you have any symptoms to push the button and record what you felt. Once you have worn this monitor for the period of time provider prescribed (Usually 14 days), you will return the monitor device in the postage paid box. Once it is returned they will download the data collected and provide [place] with a report which the provider will then review and we will call you with those results. Important tips:  Avoid showering during the first 24 hours of wearing the monitor. Avoid excessive sweating to help maximize wear time. Do not submerge the device, no hot tubs, and no swimming pools. Keep any lotions or oils away from the patch. After 24 hours you may shower with the patch on. Take brief showers with your back facing the shower head.  Do not remove patch once it has been placed because that will interrupt data and decrease adhesive wear  time. Push the button when you have any symptoms and write down what you were feeling. Once you have completed wearing your monitor, remove and place into box which has postage paid and place in your outgoing mailbox.  If for some reason you have misplaced your box then call our office and we can provide another box and/or mail it off for you.      Follow-Up: At [facility], you and your health needs are our priority.  As part of our continuing mission to provide you with exceptional heart care, we have created designated Provider Care Teams.  These Care Teams include your primary Cardiologist (physician) and Advanced Practice Providers (APPs -  Physician Assistants and Nurse Practitioners) who all work together to provide you with the care you need, when you need it.  We recommend signing up for the patient portal called "MyChart".  Sign up information is provided on this After Visit Summary.  MyChart is used to connect with patients for Virtual Visits (Telemedicine).  Patients are able to view lab/test results, encounter notes, upcoming appointments, etc.  Non-urgent messages can be sent to your provider as well.   To learn more about what you can do with MyChart, go to [email or web address].    Your next appointment:   4-5 week(s)  The format for your next appointment:   In Person  Provider:   You may see Debbe Odea or one of the following Advanced Practice Providers on your designated Care Team:   Nicolasa Ducking, NP  Eula Listen, PA-C Marisue Ivan, PA-C Cadence [place], [place] Gillian Shields, NP   Other Instructions N/A

## 2021-05-13 ENCOUNTER — Telehealth: Payer: Self-pay | Admitting: Cardiology

## 2021-05-13 NOTE — Telephone Encounter (Signed)
Results for monitor from June 30-July 11. Abnormal results being called in by Zio and spoke with Anny.   Showed she had symptomatic bradycardia 36-38 beats per minute sustained for 30 seconds  July 8 th at 09:11 AM Symptomatic  Strip number 11 page 9  July 8 08:07  am  Page 15 strip 7  July 10 At 11:27 pm Page 16 Strip 9  Report posting today and now assigned to provider for review. Will also send secure chat with alert of abnormal results.

## 2021-05-13 NOTE — Telephone Encounter (Signed)
ZIO calling with abnormal results  Transferred to [facility]

## 2021-06-18 ENCOUNTER — Ambulatory Visit (INDEPENDENT_AMBULATORY_CARE_PROVIDER_SITE_OTHER): Payer: Managed Care, Other (non HMO) | Admitting: Cardiology

## 2021-06-18 ENCOUNTER — Other Ambulatory Visit: Payer: Self-pay

## 2021-06-18 ENCOUNTER — Encounter: Payer: Self-pay | Admitting: Cardiology

## 2021-06-18 VITALS — BP 108/72 | HR 54 | Ht 66.0 in | Wt 130.0 lb

## 2021-06-18 DIAGNOSIS — R9431 Abnormal electrocardiogram [ECG] [EKG]: Secondary | ICD-10-CM

## 2021-06-18 DIAGNOSIS — I493 Ventricular premature depolarization: Secondary | ICD-10-CM | POA: Diagnosis not present

## 2021-06-18 NOTE — Progress Notes (Signed)
Cardiology Office Note:    Date:  06/18/2021   ID:  [name], DOB Jan 07, 1976, MRN [id number]  PCP:  Lynnda Child, MD   [facility] Providers Cardiologist:  None     Referring MD: Lynnda Child, MD   Chief Complaint  Patient presents with   Other    4-5 weeks follow up post ZIO. Meds reviewed verbally with patient.      History of Present Illness:    Kelly Woods is a 45 y.o. female with history of irregular heartbeats presenting for follow-up.    Noticed symptoms over a 2-week period.  Cardiac monitor was placed to evaluate any significant arrhythmias.  Apart from heart beat being prominent, denies dizziness, shortness of breath, chest pain, presyncope or syncope.  She runs for exercising, has no symptoms.  Past Medical History:  Diagnosis Date   History of chicken pox    History of seizure    as a child    Past Surgical History:  Procedure Laterality Date   COLPOSCOPY  2007    Current Medications: Current Meds  Medication Sig   Levonorgestrel (MIRENA, 52 MG, IU) by Intrauterine route.   Multiple Vitamins-Minerals (MULTIVITAMIN ADULT PO) Take by mouth.     Allergies:   Patient has no known allergies.   Social History   Socioeconomic History   Marital status: Married    Spouse name: Fayrene Fearing Rosanne Ashing)   Number of children: 2   Years of education: Bachelors degree   Highest education level: Not on file  Occupational History   Not on file  Tobacco Use   Smoking status: Never   Smokeless tobacco: Never  Vaping Use   Vaping Use: Never used  Substance and Sexual Activity   Alcohol use: Yes    Comment: 2 drinks a week or so   Drug use: Never   Sexual activity: Yes    Birth control/protection: I.U.D.    Comment: IUD - about 5 years ago  Other Topics Concern   Not on file  Social History Narrative   04/01/19   From: [place] originally    Living: with husband Fayrene Fearing and 2 kids   Work: [employer]      Family: 2 kids - Government social research officer and Ava       Enjoys: running - distance up to 1/2 marathon, hiking, geocashing       Exercise: running   Diet: cooks at home, reasonably well - overall doing well      Safety   Seat belts: Yes    Guns: No   Safe in relationships: Yes    Social Determinants of Corporate investment banker Strain: Not on file  Food Insecurity: Not on file  Transportation Needs: Not on file  Physical Activity: Not on file  Stress: Not on file  Social Connections: Not on file     Family History: The patient's family history includes Breast cancer in her paternal aunt; Diabetes in her paternal aunt; Heart disease in her maternal grandfather; Hyperlipidemia in her father; Psoriasis in her mother; Sleep apnea in her mother.  ROS:   Please see the history of present illness.     All other systems reviewed and are negative.  EKGs/Labs/Other Studies Reviewed:    The following studies were reviewed today:   EKG:  EKG not ordered today.   Recent Labs: 04/19/2021: ALT 36; BUN 14; Creatinine, Ser 0.87; Hemoglobin 13.1; Platelets 210; Potassium 4.5; Sodium 137; TSH 3.070  Recent Lipid Panel  Component Value Date/Time   CHOL 145 04/19/2021 1532   TRIG 114 04/19/2021 1532   HDL 40 04/19/2021 1532   CHOLHDL 3.6 04/19/2021 1532   LDLCALC 84 04/19/2021 1532     Risk Assessment/Calculations:          Physical Exam:    VS:  BP 108/72 (BP Location: Left Arm, Patient Position: Sitting, Cuff Size: Normal)   Pulse (!) 54   Ht 5\' 6"  (1.676 m)   Wt 130 lb (59 kg)   SpO2 96%   BMI 20.98 kg/m     Wt Readings from Last 3 Encounters:  06/18/21 130 lb (59 kg)  04/29/21 129 lb (58.5 kg)  04/19/21 129 lb 12 oz (58.9 kg)     GEN:  Well nourished, well developed in no acute distress HEENT: Normal NECK: No JVD; No carotid bruits LYMPHATICS: No lymphadenopathy CARDIAC: RRR, no murmurs, rubs, gallops RESPIRATORY:  Clear to auscultation without rales, wheezing or rhonchi  ABDOMEN: Soft, non-tender,  non-distended MUSCULOSKELETAL:  No edema; No deformity  SKIN: Warm and dry NEUROLOGIC:  Alert and oriented x 3 PSYCHIATRIC:  Normal affect   ASSESSMENT:    1. PVC (premature ventricular contraction)   2. Abnormal EKG     PLAN:    In order of problems listed above:  History of irregular heartbeats, 2-week cardiac monitor did show frequent PVCs 2.5% burden noted.  Patient overall not symptomatic, denies dizziness, chest pain.  Baseline heart rate is bradycardic at 54.  Will avoid beta-blockers at this time.  Will obtain echocardiogram to evaluate any significant structural abnormalities.  If echocardiogram is normal, plan for yearly follow-up with close monitoring.  Follow-up yearly.     Medication Adjustments/Labs and Tests Ordered: Current medicines are reviewed at length with the patient today.  Concerns regarding medicines are outlined above.  Orders Placed This Encounter  Procedures   ECHOCARDIOGRAM COMPLETE   ECHOCARDIOGRAM COMPLETE     No orders of the defined types were placed in this encounter.   Patient Instructions  Medication Instructions:  Your physician recommends that you continue on your current medications as directed. Please refer to the Current Medication list given to you today.  *If you need a refill on your cardiac medications before your next appointment, please call your pharmacy*   Lab Work: None ordered If you have labs (blood work) drawn today and your tests are completely normal, you will receive your results only by: MyChart Message (if you have MyChart) OR A paper copy in the mail If you have any lab test that is abnormal or we need to change your treatment, we will call you to review the results.   Testing/Procedures:  Your physician has requested that you have an echocardiogram. Echocardiography is a painless test that uses sound waves to create images of your heart. It provides your doctor with information about the size and shape of  your heart and how well your heart's chambers and valves are working. This procedure takes approximately one hour. There are no restrictions for this procedure.    Follow-Up: At [facility], you and your health needs are our priority.  As part of our continuing mission to provide you with exceptional heart care, we have created designated Provider Care Teams.  These Care Teams include your primary Cardiologist (physician) and Advanced Practice Providers (APPs -  Physician Assistants and Nurse Practitioners) who all work together to provide you with the care you need, when you need it.  We recommend signing  up for the patient portal called "MyChart".  Sign up information is provided on this After Visit Summary.  MyChart is used to connect with patients for Virtual Visits (Telemedicine).  Patients are able to view lab/test results, encounter notes, upcoming appointments, etc.  Non-urgent messages can be sent to your provider as well.   To learn more about what you can do with MyChart, go to [email or web address].    Your next appointment:   1 year(s)  The format for your next appointment:   In Person  Provider:   You may see Dr. Azucena Cecil or one of the following Advanced Practice Providers on your designated Care Team:   Nicolasa Ducking, NP Eula Listen, PA-C Marisue Ivan, PA-C Cadence [facility] [place], [place]   Other Instructions    Signed, Debbe Odea, MD  06/18/2021 12:40 PM    [facility]

## 2021-06-18 NOTE — Patient Instructions (Signed)
Medication Instructions:  Your physician recommends that you continue on your current medications as directed. Please refer to the Current Medication list given to you today.  *If you need a refill on your cardiac medications before your next appointment, please call your pharmacy*   Lab Work: None ordered If you have labs (blood work) drawn today and your tests are completely normal, you will receive your results only by: MyChart Message (if you have MyChart) OR A paper copy in the mail If you have any lab test that is abnormal or we need to change your treatment, we will call you to review the results.   Testing/Procedures:  Your physician has requested that you have an echocardiogram. Echocardiography is a painless test that uses sound waves to create images of your heart. It provides your doctor with information about the size and shape of your heart and how well your heart's chambers and valves are working. This procedure takes approximately one hour. There are no restrictions for this procedure.    Follow-Up: At [facility], you and your health needs are our priority.  As part of our continuing mission to provide you with exceptional heart care, we have created designated Provider Care Teams.  These Care Teams include your primary Cardiologist (physician) and Advanced Practice Providers (APPs -  Physician Assistants and Nurse Practitioners) who all work together to provide you with the care you need, when you need it.  We recommend signing up for the patient portal called "MyChart".  Sign up information is provided on this After Visit Summary.  MyChart is used to connect with patients for Virtual Visits (Telemedicine).  Patients are able to view lab/test results, encounter notes, upcoming appointments, etc.  Non-urgent messages can be sent to your provider as well.   To learn more about what you can do with MyChart, go to [email or web address].    Your next appointment:   1  year(s)  The format for your next appointment:   In Person  Provider:   You may see Dr. Azucena Cecil or one of the following Advanced Practice Providers on your designated Care Team:   Nicolasa Ducking, NP Eula Listen, PA-C Marisue Ivan, PA-C Cadence Fr[facility], [place]   Other Instructions

## 2021-07-15 ENCOUNTER — Ambulatory Visit (INDEPENDENT_AMBULATORY_CARE_PROVIDER_SITE_OTHER): Payer: Managed Care, Other (non HMO)

## 2021-07-15 ENCOUNTER — Other Ambulatory Visit: Payer: Self-pay

## 2021-07-15 DIAGNOSIS — R9431 Abnormal electrocardiogram [ECG] [EKG]: Secondary | ICD-10-CM

## 2021-07-16 LAB — ECHOCARDIOGRAM COMPLETE
AR max vel: 2.29 cm2
AV Area VTI: 2.33 cm2
AV Area mean vel: 2.21 cm2
AV Mean grad: 5 mmHg
AV Peak grad: 8.6 mmHg
Ao pk vel: 1.47 m/s
Area-P 1/2: 3.04 cm2
Calc EF: 67.1 %
S' Lateral: 3 cm
Single Plane A2C EF: 63.2 %
Single Plane A4C EF: 70.9 %

## 2021-10-04 ENCOUNTER — Other Ambulatory Visit: Payer: Self-pay

## 2021-10-04 ENCOUNTER — Ambulatory Visit
Admission: RE | Admit: 2021-10-04 | Discharge: 2021-10-04 | Disposition: A | Discharge status: Home / Self Care | Payer: Managed Care, Other (non HMO) | Source: Ambulatory Visit | Attending: Urgent Care | Admitting: Urgent Care

## 2021-10-04 VITALS — BP 153/89 | HR 64 | Temp 98.6°F | Resp 18

## 2021-10-04 DIAGNOSIS — M546 Pain in thoracic spine: Secondary | ICD-10-CM | POA: Diagnosis not present

## 2021-10-04 DIAGNOSIS — M62830 Muscle spasm of back: Secondary | ICD-10-CM

## 2021-10-04 MED ORDER — NAPROXEN 375 MG PO TABS: 375.0000 mg | ORAL_TABLET | Freq: Two times a day (BID) | ORAL | 0 refills | Status: DC

## 2021-10-04 MED ORDER — TIZANIDINE HCL 4 MG PO TABS: 4.0000 mg | ORAL_TABLET | Freq: Every day | ORAL | 0 refills | Status: DC

## 2021-10-04 NOTE — ED Provider Notes (Signed)
Kelly Woods   MRN: [id number] DOB: [birth year]  Subjective:   Kelly Woods is a 45 y.o. female presenting for 10-day history of persistent bilateral back pain of the mid to upper back.  Has felt a lot of tightness across the mid back, worse over the left side.  No fall, trauma, weakness, numbness or tingling, rashes, changes to bowel or urinary habits.  She cannot recall any particular inciting events.  Patient is an avid runner.  No fevers, weight loss.  No current facility-administered medications for this encounter.  Current Outpatient Medications:    Levonorgestrel (MIRENA, 52 MG, IU), by Intrauterine route., Disp: , Rfl:    Multiple Vitamins-Minerals (MULTIVITAMIN ADULT PO), Take by mouth., Disp: , Rfl:    No Known Allergies  Past Medical History:  Diagnosis Date   History of chicken pox    History of seizure    as a child     Past Surgical History:  Procedure Laterality Date   COLPOSCOPY  2007    Family History  Problem Relation Age of Onset   Psoriasis Mother    Sleep apnea Mother    Hyperlipidemia Father    Heart disease Maternal Grandfather        by-pass surgery   Diabetes Paternal Aunt    Breast cancer Paternal Aunt        52's?    Social History   Tobacco Use   Smoking status: Never   Smokeless tobacco: Never  Vaping Use   Vaping Use: Never used  Substance Use Topics   Alcohol use: Yes    Comment: 2 drinks a week or so   Drug use: Never    ROS   Objective:   Vitals: BP (!) 153/89   Pulse 64   Temp 98.6 F (37 C) (Oral)   Resp 18   SpO2 98%   Physical Exam Constitutional:      General: She is not in acute distress.    Appearance: Normal appearance. She is well-developed. She is not ill-appearing.  HENT:     Head: Normocephalic and atraumatic.     Nose: Nose normal.     Mouth/Throat:     Mouth: Mucous membranes are moist.     Pharynx: Oropharynx is clear.  Eyes:     General: No scleral icterus.    Extraocular  Movements: Extraocular movements intact.     Pupils: Pupils are equal, round, and reactive to light.  Cardiovascular:     Rate and Rhythm: Normal rate.  Pulmonary:     Effort: Pulmonary effort is normal.  Musculoskeletal:     Comments: Full range of motion throughout.  Strength 5/5 for upper and lower extremities.  Patient ambulates without any assistance at expected pace.  No ecchymosis, swelling, lacerations or abrasions.  Patient does have paraspinal muscle tenderness along the thoracic and upper back excluding the midline.  Significant back spasms along the left thoracic side.    Skin:    General: Skin is warm and dry.  Neurological:     General: No focal deficit present.     Mental Status: She is alert and oriented to person, place, and time.     Motor: No weakness.     Coordination: Coordination normal.     Gait: Gait normal.     Deep Tendon Reflexes: Reflexes normal.  Psychiatric:        Mood and Affect: Mood normal.        Behavior: Behavior normal.  Assessment and Plan :   PDMP not reviewed this encounter.  1. Acute bilateral thoracic back pain   2. Spasm of back muscles    Discussed the utility of x-rays and we both agreed to hold off on this for now.  Recommended conservative management with NSAID, muscle relaxant, physical therapy and/or massage therapy. Counseled patient on potential for adverse effects with medications prescribed/recommended today, ER and return-to-clinic precautions discussed, patient verbalized understanding.    Wallis Bamberg, [place] 10/04/21 [phone]

## 2021-10-04 NOTE — ED Triage Notes (Signed)
Pt here with bilateral lumbar back pain and left shoulder blade pain since Thanksgiving while sleeping on a new bed. States it is sharp and achy and sometimes feels tingly and numb.

## 2021-10-04 NOTE — Discharge Instructions (Addendum)
For the next couple of weeks please make sure that you are only doing half of what you normally do running for exercise.  Contact your insurance carrier to see where he could go for physical therapy and if you would need a referral.  It might be helpful for you to get a licensed massage therapist as well to give you that kind of therapy to.  Continue hydrating well with at least 64 ounces of water daily.  Use naproxen twice a day with food for pain and inflammation of your back spasms.  You can use muscle relaxant at bedtime.  We held off on doing an x-ray today but if your symptoms persist over the next 2 weeks then please come back in we will go ahead and proceed with that.  You can also follow-up with your PCP as necessary.

## 2021-10-14 ENCOUNTER — Other Ambulatory Visit: Payer: Self-pay

## 2021-10-14 ENCOUNTER — Encounter: Payer: Self-pay | Admitting: Family Medicine

## 2021-10-14 ENCOUNTER — Ambulatory Visit: Payer: Managed Care, Other (non HMO) | Admitting: Family Medicine

## 2021-10-14 VITALS — BP 116/66 | HR 63 | Temp 98.0°F | Ht 66.5 in | Wt 136.1 lb

## 2021-10-14 DIAGNOSIS — M545 Low back pain, unspecified: Secondary | ICD-10-CM

## 2021-10-14 DIAGNOSIS — M898X1 Other specified disorders of bone, shoulder: Secondary | ICD-10-CM | POA: Diagnosis not present

## 2021-10-14 DIAGNOSIS — M899 Disorder of bone, unspecified: Secondary | ICD-10-CM | POA: Diagnosis not present

## 2021-10-14 MED ORDER — PREDNISONE 20 MG PO TABS: ORAL_TABLET | ORAL | 0 refills | Status: DC

## 2021-10-14 NOTE — Progress Notes (Signed)
T. , MD, CAQ Sports Medicine [facility] at [facility] [address] [place], [address]  Phone: [phone]   FAX: [phone]  Kelly Woods - 45 y.o. female   MRN [id number]   Date of Birth: Mar 19, 1976  Date: 10/14/2021   PCP: Lynnda Child, MD   Referral: Lynnda Child, MD  Chief Complaint  Patient presents with   Back Pain    C/o low and upper back pain.  Started about 1 mo ago.  Seen at UC, treated with muscle relaxer, not helping.     This visit occurred during the SARS-CoV-2 public health emergency.  Safety protocols were in place, including screening questions prior to the visit, additional usage of staff PPE, and extensive cleaning of exam room while observing appropriate contact time as indicated for disinfecting solutions.   Subjective:   Kelly Woods is a 45 y.o. very pleasant female patient with Body mass index is 21.63 kg/m. who presents with the following:  Having pain in the lowest part of her back.  This started about 1 month ago.  She also is having some pain in her neck as well as in the upper trap region most on the left.  In the upper back around the left upper side of the neck and shoulder she is also having some intermittent numbness.  She is not having any cervical radiculopathy, tingling, or numbness in the distal extremity.  She went to urgent care, and they did give her some Naprosyn and Zanaflex, and this really has not been helping at all.  She has been a very active runner for many years.  She runs roughly 30 miles a week.  When she runs does feel better.  She has been running a few miles on the treadmill, this does make her back feel better.  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   No priors.  No significant history of prior back problems  She is then a trial right now for Pfizer, and she could have received an mRNA flu vaccine.  She wonders if this could be an influence.   Review of Systems is noted  in the HPI, as appropriate  Objective:   BP 116/66    Pulse 63    Temp 98 F (36.7 C) (Temporal)    Ht 5' 6.5" (1.689 m)    Wt 136 lb 1 oz (61.7 kg)    SpO2 99%    BMI 21.63 kg/m    GEN: alert,appropriate PSYCH: Normally interactive. Cooperative during the interview.   CERVICAL SPINE EXAM Range of motion: Flexion, extension, lateral bending, and rotation: Full Pain with terminal motion: Minimal to none Spinous Processes: NT SCM: NT Upper paracervical muscles: None Upper traps: Left trapezius is tender  C5-T1 intact, sensation and motor    Range of motion at  the waist: Flexion: normal Extension: normal Lateral bending: normal Rotation: all normal  No echymosis or edema Rises to examination table with no difficulty Gait: non antalgic  Inspection/Deformity: N Paraspinus Tenderness: Bilateral L3-S1  B Ankle Dorsiflexion (L5,4): 5/5 B Great Toe Dorsiflexion (L5,4): 5/5 Heel Walk (L5): WNL Toe Walk (S1): WNL Rise/Squat (L4): WNL  SENSORY B Medial Foot (L4): WNL B Dorsum (L5): WNL B Lateral (S1): WNL Light Touch: WNL Pinprick: WNL  REFLEXES Knee (L4): 2+ Ankle (S1): 2+  B SLR, seated: neg B SLR, supine: neg B FABER: neg B Reverse FABER: neg B Greater Troch: NT B Log Roll: neg B  Sciatic Notch: NT   Laboratory and Imaging Data:  Assessment and Plan:     ICD-10-CM   1. Acute bilateral low back pain without sciatica  M54.50 Ambulatory referral to Physical Therapy    2. Shoulder blade pain  M89.8X1 Ambulatory referral to Physical Therapy    3. Scapular dysfunction  M89.9 Ambulatory referral to Physical Therapy     Musculoskeletal back pain with failure of initial treatment.  I think for neck step doing a pulse of some steroids would be a good idea and doing some physical therapy for scapular stabilization, cervical spine stability, and also doing some back mobilization long-term work.  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  Meds ordered this encounter  Medications   predniSONE (DELTASONE) 20 MG tablet    Sig: 2 tabs po daily for 5 days, then 1 tab po daily for 5 days    Dispense:  15 tablet    Refill:  0   There are no discontinued medications. Orders Placed This Encounter  Procedures   Ambulatory referral to Physical Therapy    Follow-up: No need to follow-up if she is getting better.  Recommend 6 weeks if no improvement  Dragon [employer] One speech-to-text software was used for transcription in this dictation.  Possible transcriptional errors can occur using Animal nutritionist.   Signed,  Elpidio Galea. , MD   Outpatient Encounter Medications as of 10/14/2021  Medication Sig   Levonorgestrel (MIRENA, 52 MG, IU) by Intrauterine route.   Multiple Vitamins-Minerals (MULTIVITAMIN ADULT PO) Take by mouth.   naproxen (NAPROSYN) 375 MG tablet Take 1 tablet (375 mg total) by mouth 2 (two) times daily with a meal.   predniSONE (DELTASONE) 20 MG tablet 2 tabs po daily for 5 days, then 1 tab po daily for 5 days   tiZANidine (ZANAFLEX) 4 MG tablet Take 1 tablet (4 mg total) by mouth at bedtime.   No facility-administered encounter medications on file as of 10/14/2021.

## 2021-10-15 ENCOUNTER — Encounter: Payer: Self-pay | Admitting: Family Medicine

## 2021-10-26 ENCOUNTER — Telehealth: Payer: Self-pay | Admitting: Family Medicine

## 2021-10-26 NOTE — Telephone Encounter (Signed)
Pt called stating that she had an appt with Dr Patsy Lager on 10/14/21. Pt stated that Dr Patsy Lager was going to put in a referral for  PT. Pt called checking on the status of that. Please advise.

## 2021-10-27 NOTE — Telephone Encounter (Signed)
Referral was sent to [facility] on 10/20/21 Kelly Woods, PT Address: [address] [place], [place] [address] Phone: [phone]  He should be contacting the patient to schedule but the patient may want to reach out to them to schedule. Not sure how far out they are with referrals.

## 2021-10-28 NOTE — Telephone Encounter (Signed)
Mychart sent to pt relaying referral information

## 2023-03-01 ENCOUNTER — Encounter: Payer: Managed Care, Other (non HMO) | Admitting: Family Medicine

## 2023-03-23 ENCOUNTER — Ambulatory Visit: Payer: Managed Care, Other (non HMO) | Admitting: Nurse Practitioner

## 2023-03-23 ENCOUNTER — Encounter: Payer: Self-pay | Admitting: Nurse Practitioner

## 2023-03-23 ENCOUNTER — Other Ambulatory Visit (HOSPITAL_COMMUNITY)
Admission: RE | Admit: 2023-03-23 | Discharge: 2023-03-23 | Disposition: A | Discharge status: Home / Self Care | Payer: Managed Care, Other (non HMO) | Source: Ambulatory Visit | Attending: Family Medicine | Admitting: Family Medicine

## 2023-03-23 VITALS — BP 100/70 | HR 56 | Temp 98.5°F | Ht 66.5 in | Wt 136.0 lb

## 2023-03-23 DIAGNOSIS — Z13220 Encounter for screening for lipoid disorders: Secondary | ICD-10-CM

## 2023-03-23 DIAGNOSIS — Z124 Encounter for screening for malignant neoplasm of cervix: Secondary | ICD-10-CM

## 2023-03-23 DIAGNOSIS — Z23 Encounter for immunization: Secondary | ICD-10-CM

## 2023-03-23 DIAGNOSIS — Z1329 Encounter for screening for other suspected endocrine disorder: Secondary | ICD-10-CM | POA: Diagnosis not present

## 2023-03-23 DIAGNOSIS — Z862 Personal history of diseases of the blood and blood-forming organs and certain disorders involving the immune mechanism: Secondary | ICD-10-CM | POA: Insufficient documentation

## 2023-03-23 DIAGNOSIS — Z1231 Encounter for screening mammogram for malignant neoplasm of breast: Secondary | ICD-10-CM

## 2023-03-23 DIAGNOSIS — R001 Bradycardia, unspecified: Secondary | ICD-10-CM | POA: Diagnosis not present

## 2023-03-23 DIAGNOSIS — Z0000 Encounter for general adult medical examination without abnormal findings: Secondary | ICD-10-CM | POA: Diagnosis not present

## 2023-03-23 DIAGNOSIS — Z01419 Encounter for gynecological examination (general) (routine) without abnormal findings: Secondary | ICD-10-CM | POA: Diagnosis not present

## 2023-03-23 DIAGNOSIS — Z1211 Encounter for screening for malignant neoplasm of colon: Secondary | ICD-10-CM

## 2023-03-23 NOTE — Progress Notes (Signed)
Kelly Dicker, NP-C Phone: [phone]  Kelly Woods is a 47 y.o. female who presents today for transfer of care and annual exam. She has no complaints or new concerns today. She is not on any medications.   Diet: Well rounded- cooks mostly at home, likes vegetables Exercise: Runs 5-10 miles 4 times per week, does some strength training  Pap smear: 05/01/2019- Due! Colonoscopy: Never- Due! Mammogram: 06/21/2019- Due! Family history-  Colon cancer: No  Breast cancer: Yes, paternal aunt  Ovarian cancer: No Vaccines-   Flu: UTD  Tetanus: 04/04/2023- Due!  COVID19: x 3 HIV screening: Negative Hep C Screening: Negative Tobacco use: No Alcohol use: Yes, approx. 5 drinks per week Illicit Drug use: No Dentist: Yes Ophthalmology: Yes  [id number] Wt Readings from Last 3 Encounters:  03/23/23 136 lb (61.7 kg)  10/14/21 136 lb 1 oz (61.7 kg)  06/18/21 130 lb (59 kg)   Last period:   Regular periods: No- IUD  Heavy bleeding: N/A  Sexually active: yes Birth control or hormonal therapy: Mirena IUD Hx of STD: Patient desires STD screening Dyspareunia: No Hot flashes: Occasionally at night Vaginal discharge: No Dysuria: No   Breast mass or concerns: No History of abnormal pap: Yes, Hx colposcopy in early 2000s    Social History   Tobacco Use  Smoking Status Never  Smokeless Tobacco Never    Current Outpatient Medications on File Prior to Visit  Medication Sig Dispense Refill   Levonorgestrel (MIRENA, 52 MG, IU) by Intrauterine route.     Multiple Vitamins-Minerals (MULTIVITAMIN ADULT PO) Take by mouth.     No current facility-administered medications on file prior to visit.    ROS see history of present illness  Objective  Physical Exam Vitals:   03/23/23 0959  BP: 100/70  Pulse: (!) 56  Temp: 98.5 F (36.9 C)  SpO2: 99%    BP Readings from Last 3 Encounters:  03/23/23 100/70  10/14/21 116/66  10/04/21 (!) 153/89   Wt Readings from Last 3 Encounters:   03/23/23 136 lb (61.7 kg)  10/14/21 136 lb 1 oz (61.7 kg)  06/18/21 130 lb (59 kg)    Physical Exam Exam conducted with a chaperone present Donavan Foil, CMA).  Constitutional:      General: She is not in acute distress.    Appearance: Normal appearance.  HENT:     Head: Normocephalic.     Right Ear: Tympanic membrane normal.     Left Ear: Tympanic membrane normal.     Nose: Nose normal.     Mouth/Throat:     Mouth: Mucous membranes are moist.     Pharynx: Oropharynx is clear.  Eyes:     Conjunctiva/sclera: Conjunctivae normal.     Pupils: Pupils are equal, round, and reactive to light.  Neck:     Thyroid: No thyromegaly.  Cardiovascular:     Rate and Rhythm: Regular rhythm. Bradycardia present.     Heart sounds: Normal heart sounds.  Pulmonary:     Effort: Pulmonary effort is normal.     Breath sounds: Normal breath sounds.  Abdominal:     General: Abdomen is flat. Bowel sounds are normal.     Palpations: Abdomen is soft. There is no mass.     Tenderness: There is no abdominal tenderness.  Genitourinary:    Pubic Area: No rash.      Labia:        Right: No rash or lesion.        Left: No  rash or lesion.      Vagina: Normal. No vaginal discharge, bleeding or lesions.     Cervix: Normal.     Uterus: Normal.      Comments: IUD strings visualized.  Musculoskeletal:        General: Normal range of motion.  Lymphadenopathy:     Cervical: No cervical adenopathy.  Skin:    General: Skin is warm and dry.     Findings: No rash.  Neurological:     General: No focal deficit present.     Mental Status: She is alert.  Psychiatric:        Mood and Affect: Mood normal.        Behavior: Behavior normal.    Assessment/Plan: Please see individual problem list.  Well woman exam with routine gynecological exam Assessment & Plan: Physical exam complete. Lab work as outlined. Will contact patient with results. Pap completed today in office. Mammogram- due. Order placed,  encouraged patient to call to schedule. Colonoscopy- due in July. Referral placed to GI. Flu vaccine- UTD. Tetanus vaccine due in June, given to patient today while in office. Declined additional COVID vaccines. HIV/Hep C screenings negative. Recommended follow ups with Dentist and Ophthalmology for annual exams. Encouraged to continue healthy diet and exercise. Return to care in one year, sooner PRN.   Orders: -     CBC with Differential/Platelet -     Comprehensive metabolic panel -     VITAMIN D 25 Hydroxy (Vit-D Deficiency, Fractures)  Bradycardia Assessment & Plan: Chronic. Patient monitors with her Apple Watch. 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 Denies dizziness. Will continue to monitor.    History of iron deficiency anemia Assessment & Plan: Patient reports significant history of IDA likely due to heavy menstrual periods when younger. She took an iron supplement daily for many years. She has not been taking a supplement since getting her IUD placed as she is no longer having periods. She would like to have her iron checked. Lab work as outlined.   Orders: -     Iron, TIBC and Ferritin Panel  Screening for cervical cancer -     Cytology - PAP  Screen for colon cancer -     Ambulatory referral to Gastroenterology  Screening mammogram for breast cancer -     3D Screening Mammogram, Left and Right; Future  Thyroid disorder screen -     TSH  Lipid screening -     Lipid panel  Need for Tdap vaccination -     Tdap vaccine greater than or equal to 7yo IM   Return in about 1 year (around 03/22/2024) for Annual Exam, sooner PRN.   Kelly Dicker, NP-C [facility] [employer]

## 2023-03-23 NOTE — Assessment & Plan Note (Signed)
Patient reports significant history of IDA likely due to heavy menstrual periods when younger. She took an iron supplement daily for many years. She has not been taking a supplement since getting her IUD placed as she is no longer having periods. She would like to have her iron checked. Lab work as outlined.

## 2023-03-23 NOTE — Assessment & Plan Note (Signed)
Chronic. Patient monitors with her Apple Watch. 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 Denies dizziness. Will continue to monitor.

## 2023-03-23 NOTE — Patient Instructions (Signed)
YOUR MAMMOGRAM IS DUE, PLEASE CALL AND GET THIS SCHEDULED! [facility] - call [phone]    

## 2023-03-23 NOTE — Assessment & Plan Note (Signed)
Physical exam complete. Lab work as outlined. Will contact patient with results. Pap completed today in office. Mammogram- due. Order placed, encouraged patient to call to schedule. Colonoscopy- due in July. Referral placed to GI. Flu vaccine- UTD. Tetanus vaccine due in June, given to patient today while in office. Declined additional COVID vaccines. HIV/Hep C screenings negative. Recommended follow ups with Dentist and Ophthalmology for annual exams. Encouraged to continue healthy diet and exercise. Return to care in one year, sooner PRN.

## 2023-03-24 ENCOUNTER — Telehealth: Payer: Self-pay

## 2023-03-24 ENCOUNTER — Other Ambulatory Visit: Payer: Self-pay

## 2023-03-24 DIAGNOSIS — Z1211 Encounter for screening for malignant neoplasm of colon: Secondary | ICD-10-CM

## 2023-03-24 LAB — CBC WITH DIFFERENTIAL/PLATELET
Basophils Absolute: 0 10*3/uL (ref 0.0–0.2)
Basos: 1 %
EOS (ABSOLUTE): 0 10*3/uL (ref 0.0–0.4)
Eos: 0 %
Hematocrit: 39.2 % (ref 34.0–46.6)
Hemoglobin: 12.1 g/dL (ref 11.1–15.9)
Immature Grans (Abs): 0 10*3/uL (ref 0.0–0.1)
Immature Granulocytes: 0 %
Lymphocytes Absolute: 1.3 10*3/uL (ref 0.7–3.1)
Lymphs: 21 %
MCH: 27.1 pg (ref 26.6–33.0)
MCHC: 30.9 g/dL — ABNORMAL LOW (ref 31.5–35.7)
MCV: 88 fL (ref 79–97)
Monocytes Absolute: 0.4 10*3/uL (ref 0.1–0.9)
Monocytes: 6 %
Neutrophils Absolute: 4.5 10*3/uL (ref 1.4–7.0)
Neutrophils: 72 %
Platelets: 197 10*3/uL (ref 150–450)
RBC: 4.47 x10E6/uL (ref 3.77–5.28)
RDW: 12.5 % (ref 11.7–15.4)
WBC: 6.1 10*3/uL (ref 3.4–10.8)

## 2023-03-24 LAB — COMPREHENSIVE METABOLIC PANEL
ALT: 24 IU/L (ref 0–32)
AST: 25 IU/L (ref 0–40)
Albumin/Globulin Ratio: 2 (ref 1.2–2.2)
Albumin: 4.5 g/dL (ref 3.9–4.9)
Alkaline Phosphatase: 68 IU/L (ref 44–121)
BUN/Creatinine Ratio: 17 (ref 9–23)
BUN: 14 mg/dL (ref 6–24)
Bilirubin Total: 0.4 mg/dL (ref 0.0–1.2)
CO2: 22 mmol/L (ref 20–29)
Calcium: 9.4 mg/dL (ref 8.7–10.2)
Chloride: 102 mmol/L (ref 96–106)
Creatinine, Ser: 0.84 mg/dL (ref 0.57–1.00)
Globulin, Total: 2.3 g/dL (ref 1.5–4.5)
Glucose: 85 mg/dL (ref 70–99)
Potassium: 4.2 mmol/L (ref 3.5–5.2)
Sodium: 140 mmol/L (ref 134–144)
Total Protein: 6.8 g/dL (ref 6.0–8.5)
eGFR: 88 mL/min/{1.73_m2} (ref >59)

## 2023-03-24 LAB — LIPID PANEL
Chol/HDL Ratio: 2.8 ratio (ref 0.0–4.4)
Cholesterol, Total: 131 mg/dL (ref 100–199)
HDL: 46 mg/dL (ref >39)
LDL Chol Calc (NIH): 72 mg/dL (ref 0–99)
Triglycerides: 63 mg/dL (ref 0–149)
VLDL Cholesterol Cal: 13 mg/dL (ref 5–40)

## 2023-03-24 LAB — IRON,TIBC AND FERRITIN PANEL
Ferritin: 107 ng/mL (ref 15–150)
Iron Saturation: 26 % (ref 15–55)
Iron: 78 ug/dL (ref 27–159)
Total Iron Binding Capacity: 305 ug/dL (ref 250–450)
UIBC: 227 ug/dL (ref 131–425)

## 2023-03-24 LAB — TSH: TSH: 3.11 u[IU]/mL (ref 0.450–4.500)

## 2023-03-24 LAB — VITAMIN D 25 HYDROXY (VIT D DEFICIENCY, FRACTURES): Vit D, 25-Hydroxy: 33.9 ng/mL (ref 30.0–100.0)

## 2023-03-24 MED ORDER — NA SULFATE-K SULFATE-MG SULF 17.5-3.13-1.6 GM/177ML PO SOLN: 1.0000 | Freq: Once | ORAL | 0 refills | Status: AC

## 2023-03-24 NOTE — Telephone Encounter (Signed)
Gastroenterology Pre-Procedure Review  Request Date: 06/12/23 Requesting Physician: Dr. Allegra Lai  PATIENT REVIEW QUESTIONS: The patient responded to the following health history questions as indicated:    1. Are you having any GI issues? no 2. Do you have a personal history of Polyps? no 3. Do you have a family history of Colon Cancer or Polyps? no 4. Diabetes Mellitus? no 5. Joint replacements in the past 12 months?no 6. Major health problems in the past 3 months?no 7. Any artificial heart valves, MVP, or defibrillator?no    MEDICATIONS & ALLERGIES:    Patient reports the following regarding taking any anticoagulation/antiplatelet therapy:   Plavix, Coumadin, Eliquis, Xarelto, Lovenox, Pradaxa, Brilinta, or Effient? no Aspirin? no  Patient confirms/reports the following medications:  Current Outpatient Medications  Medication Sig Dispense Refill   Levonorgestrel (MIRENA, 52 MG, IU) by Intrauterine route.     Multiple Vitamins-Minerals (MULTIVITAMIN ADULT PO) Take by mouth.     Na Sulfate-K Sulfate-Mg Sulf 17.5-3.13-1.6 GM/177ML SOLN Take 1 kit by mouth once for 1 dose. 354 mL 0   No current facility-administered medications for this visit.    Patient confirms/reports the following allergies:  No Known Allergies  No orders of the defined types were placed in this encounter.   AUTHORIZATION INFORMATION Primary Insurance: 1D#: Group #:  Secondary Insurance: 1D#: Group #:  SCHEDULE INFORMATION: Date: 06/12/23 Time: Location: [facility]

## 2023-03-30 LAB — CYTOLOGY - PAP
Chlamydia: NEGATIVE
Comment: NEGATIVE
Comment: NEGATIVE
Comment: NEGATIVE
Comment: NORMAL
Diagnosis: UNDETERMINED — AB
High risk HPV: NEGATIVE
Neisseria Gonorrhea: NEGATIVE
Trichomonas: NEGATIVE

## 2023-03-31 ENCOUNTER — Ambulatory Visit
Admission: RE | Admit: 2023-03-31 | Discharge: 2023-03-31 | Disposition: A | Discharge status: Home / Self Care | Payer: Managed Care, Other (non HMO) | Source: Ambulatory Visit | Attending: Nurse Practitioner

## 2023-03-31 DIAGNOSIS — Z1231 Encounter for screening mammogram for malignant neoplasm of breast: Secondary | ICD-10-CM | POA: Insufficient documentation

## 2023-05-15 ENCOUNTER — Telehealth: Payer: Self-pay | Admitting: Nurse Practitioner

## 2023-05-15 NOTE — Telephone Encounter (Signed)
Pain has calmed down since Saturday.  Pain is  in the lower abdomen, its a dull ache before it was stronger. She would feel it most if she moved and even while laying down triggered it. She took  a laxative due to her thinking that's what it was her being backed up and needing to go.  She is scheduled with Dr. Birdie Sons tomorrow   No nausea, no fever  Pt hs been informed that if things worsen she needs to be seen at an urgent care and not wait until tomorrow she has verbalized understanding.

## 2023-05-15 NOTE — Telephone Encounter (Signed)
  Symptoms: abdominal pain      Attributing factors (medication changes, positional changes, etc. ) no      Duration : Started Saturday night     Pain Scale?  On 1-10 how woiuld you rate your pain? What makes it better or worse?       Blood pressure            Pulse             Temp

## 2023-05-16 ENCOUNTER — Encounter: Payer: Self-pay | Admitting: Family Medicine

## 2023-05-16 ENCOUNTER — Ambulatory Visit
Admission: RE | Admit: 2023-05-16 | Discharge: 2023-05-16 | Disposition: A | Discharge status: Home / Self Care | Payer: Managed Care, Other (non HMO) | Source: Ambulatory Visit | Attending: Family Medicine | Admitting: Family Medicine

## 2023-05-16 ENCOUNTER — Ambulatory Visit: Payer: Managed Care, Other (non HMO) | Admitting: Family Medicine

## 2023-05-16 ENCOUNTER — Other Ambulatory Visit: Payer: Self-pay | Admitting: Family Medicine

## 2023-05-16 VITALS — BP 124/76 | HR 51 | Temp 98.4°F | Ht 66.5 in | Wt 130.0 lb

## 2023-05-16 DIAGNOSIS — R1031 Right lower quadrant pain: Secondary | ICD-10-CM | POA: Insufficient documentation

## 2023-05-16 DIAGNOSIS — N838 Other noninflammatory disorders of ovary, fallopian tube and broad ligament: Secondary | ICD-10-CM | POA: Insufficient documentation

## 2023-05-16 DIAGNOSIS — R1900 Intra-abdominal and pelvic swelling, mass and lump, unspecified site: Secondary | ICD-10-CM

## 2023-05-16 LAB — POCT URINALYSIS DIPSTICK
Glucose, UA: NEGATIVE
Nitrite, UA: NEGATIVE
Protein, UA: POSITIVE — AB
Spec Grav, UA: 1.025 (ref 1.010–1.025)
Urobilinogen, UA: 1 E.U./dL
pH, UA: 5.5 (ref 5.0–8.0)

## 2023-05-16 LAB — POCT URINE PREGNANCY: Preg Test, Ur: NEGATIVE

## 2023-05-16 MED ORDER — IOHEXOL 300 MG/ML  SOLN
80.0000 mL | Freq: Once | INTRAMUSCULAR | Status: AC | PRN
  Administered 2023-05-16: 80 mL via INTRAVENOUS

## 2023-05-16 NOTE — Progress Notes (Addendum)
Marikay Alar, MD Phone: [phone]  Kelly Woods is a 47 y.o. female who presents today for same-day visit.  Abdominal pain: Patient reports abdominal pain starting 05/13/2023.  Felt a twisty feeling in her lower abdomen at bedtime and was quite uncomfortable overnight.  Laying down made it worse.  She is more comfortable sitting up.  Bothers her when she is up moving around.  She felt a little bit better yesterday.  She did have some diarrhea though she notes this following taking a laxative so she is not sure if the laxative cause diarrhea or for that is related to her abdominal pain.  No nausea or vomiting.  No vaginal bleeding or vaginal discharge.  No blood in her stool.  No blood in her urine.  No dysuria.  No urinary frequency.  No urinary urgency.  No fevers.  She has not IUD in place.  No abdominal surgeries in the past per her report.  She typically has regular bowel movements.  Social History   Tobacco Use  Smoking Status Never  Smokeless Tobacco Never    Current Outpatient Medications on File Prior to Visit  Medication Sig Dispense Refill   Levonorgestrel (MIRENA, 52 MG, IU) by Intrauterine route.     Multiple Vitamins-Minerals (MULTIVITAMIN ADULT PO) Take by mouth.     No current facility-administered medications on file prior to visit.     ROS see history of present illness  Objective  Physical Exam Vitals:   05/16/23 0853  BP: 124/76  Pulse: (!) 51  Temp: 98.4 F (36.9 C)  SpO2: 98%    BP Readings from Last 3 Encounters:  05/16/23 124/76  03/23/23 100/70  10/14/21 116/66   Wt Readings from Last 3 Encounters:  05/16/23 130 lb (59 kg)  03/23/23 136 lb (61.7 kg)  10/14/21 136 lb 1 oz (61.7 kg)    Physical Exam Constitutional:      General: She is not in acute distress.    Appearance: She is not diaphoretic.  Cardiovascular:     Rate and Rhythm: Normal rate and regular rhythm.     Heart sounds: Normal heart sounds.  Pulmonary:     Effort:  Pulmonary effort is normal.     Breath sounds: Normal breath sounds.  Abdominal:     General: Bowel sounds are normal. There is no distension.     Palpations: Abdomen is soft.     Tenderness: There is abdominal tenderness (Relatively significant, right lower quadrant at McBurney's point).  Skin:    General: Skin is warm and dry.  Neurological:     Mental Status: She is alert.      Assessment/Plan: Please see individual problem list.  Right lower quadrant abdominal pain Assessment & Plan: Patient with new onset lower abdominal discomfort with tenderness specifically at McBurney's point.  Discussed the concern for appendicitis given the location of her tenderness.  CT abdomen pelvis with contrast has been ordered and will be scheduled for today.  Lab work to be completed as well.  Urine pregnancy test negative.  Urinalysis with several abnormalities.  We will send urine for culture microscopy.  I discussed neck step in management would depend on what her workup reveals.  Advised if she develops worsening of abdominal pain prior to getting her CT scan she should go to the emergency department for evaluation.  Orders: -     POCT urinalysis dipstick -     POCT urine pregnancy -     CT ABDOMEN PELVIS W  CONTRAST; Future -     CBC with Differential/Platelet -     Comprehensive metabolic panel -     Urine Culture -     Urine Microscopic    Return if symptoms worsen or fail to improve.   Marikay Alar, MD [facility]

## 2023-05-16 NOTE — Assessment & Plan Note (Addendum)
Patient with new onset lower abdominal discomfort with tenderness specifically at McBurney's point.  Discussed the concern for appendicitis given the location of her tenderness.  CT abdomen pelvis with contrast has been ordered and will be scheduled for today.  Lab work to be completed as well.  Urine pregnancy test negative.  Urinalysis with several abnormalities.  We will send urine for culture microscopy.  I discussed neck step in management would depend on what her workup reveals.  Advised if she develops worsening of abdominal pain prior to getting her CT scan she should go to the emergency department for evaluation.

## 2023-05-16 NOTE — Patient Instructions (Addendum)
Nice to see you. We will contact you with your results. If you develop worsening abdominal pain prior to having your CT scan done please go to the emergency department.

## 2023-05-16 NOTE — Addendum Note (Signed)
Addended by: Glori Luis on: 05/16/2023 09:35 AM   Modules accepted: Orders

## 2023-05-17 ENCOUNTER — Other Ambulatory Visit: Payer: Self-pay | Admitting: Family Medicine

## 2023-05-17 ENCOUNTER — Ambulatory Visit
Admission: RE | Admit: 2023-05-17 | Discharge: 2023-05-17 | Disposition: A | Discharge status: Home / Self Care | Payer: Managed Care, Other (non HMO) | Source: Ambulatory Visit | Attending: Family Medicine | Admitting: Family Medicine

## 2023-05-17 DIAGNOSIS — N838 Other noninflammatory disorders of ovary, fallopian tube and broad ligament: Secondary | ICD-10-CM

## 2023-05-17 LAB — CBC WITH DIFFERENTIAL/PLATELET
Basophils Absolute: 0 10*3/uL (ref 0.0–0.2)
Basos: 0 %
EOS (ABSOLUTE): 0 10*3/uL (ref 0.0–0.4)
Eos: 0 %
Hematocrit: 38.8 % (ref 34.0–46.6)
Hemoglobin: 12.5 g/dL (ref 11.1–15.9)
Immature Grans (Abs): 0 10*3/uL (ref 0.0–0.1)
Immature Granulocytes: 0 %
Lymphocytes Absolute: 1.1 10*3/uL (ref 0.7–3.1)
Lymphs: 22 %
MCH: 27.9 pg (ref 26.6–33.0)
MCHC: 32.2 g/dL (ref 31.5–35.7)
MCV: 87 fL (ref 79–97)
Monocytes Absolute: 0.4 10*3/uL (ref 0.1–0.9)
Monocytes: 8 %
Neutrophils Absolute: 3.7 10*3/uL (ref 1.4–7.0)
Neutrophils: 70 %
Platelets: 185 10*3/uL (ref 150–450)
RBC: 4.48 x10E6/uL (ref 3.77–5.28)
RDW: 12.1 % (ref 11.7–15.4)
WBC: 5.3 10*3/uL (ref 3.4–10.8)

## 2023-05-17 LAB — URINALYSIS, MICROSCOPIC ONLY
Casts: NONE SEEN /lpf
Epithelial Cells (non renal): >10 /hpf — AB (ref 0–10)

## 2023-05-17 LAB — COMPREHENSIVE METABOLIC PANEL
ALT: 16 IU/L (ref 0–32)
AST: 24 IU/L (ref 0–40)
Albumin: 4.5 g/dL (ref 3.9–4.9)
Alkaline Phosphatase: 99 IU/L (ref 44–121)
BUN/Creatinine Ratio: 12 (ref 9–23)
BUN: 11 mg/dL (ref 6–24)
Bilirubin Total: 0.7 mg/dL (ref 0.0–1.2)
CO2: 25 mmol/L (ref 20–29)
Calcium: 9.8 mg/dL (ref 8.7–10.2)
Chloride: 103 mmol/L (ref 96–106)
Creatinine, Ser: 0.94 mg/dL (ref 0.57–1.00)
Globulin, Total: 2.3 g/dL (ref 1.5–4.5)
Glucose: 93 mg/dL (ref 70–99)
Potassium: 5 mmol/L (ref 3.5–5.2)
Sodium: 140 mmol/L (ref 134–144)
Total Protein: 6.8 g/dL (ref 6.0–8.5)
eGFR: 76 mL/min/{1.73_m2} (ref >59)

## 2023-05-18 ENCOUNTER — Other Ambulatory Visit: Payer: Self-pay | Admitting: Family Medicine

## 2023-05-18 DIAGNOSIS — N838 Other noninflammatory disorders of ovary, fallopian tube and broad ligament: Secondary | ICD-10-CM

## 2023-05-18 LAB — URINE CULTURE: Organism ID, Bacteria: NO GROWTH

## 2023-05-19 ENCOUNTER — Telehealth: Payer: Self-pay | Admitting: Nurse Practitioner

## 2023-05-19 ENCOUNTER — Encounter: Payer: Self-pay | Admitting: Family Medicine

## 2023-05-19 NOTE — Telephone Encounter (Signed)
Pt called stating no one has contacted her insurance company about two referrals that sonneberg put in. Pt need a PA for referrals

## 2023-05-22 ENCOUNTER — Other Ambulatory Visit: Payer: Self-pay | Admitting: Family Medicine

## 2023-05-22 DIAGNOSIS — N838 Other noninflammatory disorders of ovary, fallopian tube and broad ligament: Secondary | ICD-10-CM

## 2023-05-22 NOTE — Telephone Encounter (Signed)
Discussed situation with Georga Bora.  She looked at the approval portion of the patient's chart and noted that it had not been approved as they were not able to find the patient's information with the insurance company once it was submitted.  She suspected that that was likely related to the computer issues that were occurring last week.  I have sent a message to our referral coordinator to try to get this approval process completed. We are going to change the priority of the MRI to stat so that it will be read by the time the patient sees gynecologic oncology on Wednesday.  I attempted to contact the patient to relay all of this to her though there was no answer.  I left a message asking her to call back to the office.

## 2023-05-22 NOTE — Telephone Encounter (Signed)
Patient called and front office updated patient on MRI referral. Patient was advised that front office will contact referrals and from there referrals will contact patient.

## 2023-05-23 ENCOUNTER — Ambulatory Visit
Admission: RE | Admit: 2023-05-23 | Discharge: 2023-05-23 | Disposition: A | Discharge status: Home / Self Care | Payer: Managed Care, Other (non HMO) | Source: Ambulatory Visit | Attending: Family Medicine | Admitting: Family Medicine

## 2023-05-23 DIAGNOSIS — N838 Other noninflammatory disorders of ovary, fallopian tube and broad ligament: Secondary | ICD-10-CM | POA: Insufficient documentation

## 2023-05-23 MED ORDER — GADOBUTROL 1 MMOL/ML IV SOLN
5.0000 mL | Freq: Once | INTRAVENOUS | Status: AC | PRN
  Administered 2023-05-23: 5 mL via INTRAVENOUS

## 2023-05-24 ENCOUNTER — Inpatient Hospital Stay: Payer: Managed Care, Other (non HMO) | Attending: Obstetrics and Gynecology | Admitting: Obstetrics and Gynecology

## 2023-05-24 ENCOUNTER — Encounter: Payer: Self-pay | Admitting: Obstetrics and Gynecology

## 2023-05-24 ENCOUNTER — Inpatient Hospital Stay: Payer: Managed Care, Other (non HMO)

## 2023-05-24 VITALS — BP 103/71 | HR 55 | Temp 96.6°F | Resp 20 | Wt 131.8 lb

## 2023-05-24 DIAGNOSIS — R3129 Other microscopic hematuria: Secondary | ICD-10-CM

## 2023-05-24 DIAGNOSIS — I7300 Raynaud's syndrome without gangrene: Secondary | ICD-10-CM | POA: Insufficient documentation

## 2023-05-24 DIAGNOSIS — N838 Other noninflammatory disorders of ovary, fallopian tube and broad ligament: Secondary | ICD-10-CM

## 2023-05-24 DIAGNOSIS — R001 Bradycardia, unspecified: Secondary | ICD-10-CM | POA: Diagnosis not present

## 2023-05-24 DIAGNOSIS — Z803 Family history of malignant neoplasm of breast: Secondary | ICD-10-CM | POA: Insufficient documentation

## 2023-05-24 DIAGNOSIS — R1900 Intra-abdominal and pelvic swelling, mass and lump, unspecified site: Secondary | ICD-10-CM | POA: Diagnosis present

## 2023-05-24 DIAGNOSIS — Z975 Presence of (intrauterine) contraceptive device: Secondary | ICD-10-CM | POA: Insufficient documentation

## 2023-05-24 DIAGNOSIS — R319 Hematuria, unspecified: Secondary | ICD-10-CM | POA: Insufficient documentation

## 2023-05-24 DIAGNOSIS — R1031 Right lower quadrant pain: Secondary | ICD-10-CM | POA: Diagnosis not present

## 2023-05-24 LAB — CBC WITH DIFFERENTIAL/PLATELET
Abs Immature Granulocytes: 0 10*3/uL (ref 0.00–0.07)
Basophils Absolute: 0 10*3/uL (ref 0.0–0.1)
Basophils Relative: 0 %
Eosinophils Absolute: 0 10*3/uL (ref 0.0–0.5)
Eosinophils Relative: 0 %
HCT: 41.8 % (ref 36.0–46.0)
Hemoglobin: 13.4 g/dL (ref 12.0–15.0)
Immature Granulocytes: 0 %
Lymphocytes Relative: 29 %
Lymphs Abs: 1.4 10*3/uL (ref 0.7–4.0)
MCH: 27.3 pg (ref 26.0–34.0)
MCHC: 32.1 g/dL (ref 30.0–36.0)
MCV: 85.1 fL (ref 80.0–100.0)
Monocytes Absolute: 0.3 10*3/uL (ref 0.1–1.0)
Monocytes Relative: 6 %
Neutro Abs: 3.2 10*3/uL (ref 1.7–7.7)
Neutrophils Relative %: 65 %
Platelets: 227 10*3/uL (ref 150–400)
RBC: 4.91 MIL/uL (ref 3.87–5.11)
RDW: 12.6 % (ref 11.5–15.5)
WBC: 5 10*3/uL (ref 4.0–10.5)
nRBC: 0 % (ref 0.0–0.2)

## 2023-05-24 LAB — COMPREHENSIVE METABOLIC PANEL WITH GFR
ALT: 20 U/L (ref 0–44)
AST: 24 U/L (ref 15–41)
Albumin: 4.8 g/dL (ref 3.5–5.0)
Alkaline Phosphatase: 66 U/L (ref 38–126)
Anion gap: 7 (ref 5–15)
BUN: 12 mg/dL (ref 6–20)
CO2: 27 mmol/L (ref 22–32)
Calcium: 9.5 mg/dL (ref 8.9–10.3)
Chloride: 103 mmol/L (ref 98–111)
Creatinine, Ser: 0.66 mg/dL (ref 0.44–1.00)
GFR, Estimated: >60 mL/min
Glucose, Bld: 87 mg/dL (ref 70–99)
Potassium: 4 mmol/L (ref 3.5–5.1)
Sodium: 137 mmol/L (ref 135–145)
Total Bilirubin: 0.7 mg/dL (ref 0.3–1.2)
Total Protein: 7.7 g/dL (ref 6.5–8.1)

## 2023-05-24 LAB — URINALYSIS, COMPLETE (UACMP) WITH MICROSCOPIC
Bacteria, UA: NONE SEEN
Bilirubin Urine: NEGATIVE
Glucose, UA: NEGATIVE mg/dL
Hgb urine dipstick: NEGATIVE
Ketones, ur: NEGATIVE mg/dL
Leukocytes,Ua: NEGATIVE
Nitrite: NEGATIVE
Protein, ur: NEGATIVE mg/dL
Specific Gravity, Urine: 1.017 (ref 1.005–1.030)
WBC, UA: NONE SEEN WBC/hpf (ref 0–5)
pH: 7 (ref 5.0–8.0)

## 2023-05-24 NOTE — H&P (View-Only) (Signed)
 Gynecologic Oncology H&P   Referring Provider: Dr Birdie Sons  Chief Concern: Large pelvic mass  Subjective:  Kelly Woods is a 47 y.o. female who is seen in consultation from Dr. Birdie Sons for large pelvic mass.   Saw Dr Birdie Sons 05/11/23. "Patient reports abdominal pain starting 05/13/2023. Felt a twisty feeling in her lower abdomen at bedtime and was quite uncomfortable overnight. Laying down made it worse. She is more comfortable sitting up. Bothers her when she is up moving around.She did have some diarrhea though she notes this following taking a laxative so she is not sure if the laxative cause diarrhea or for that is related to her abdominal pain. No nausea or vomiting. No vaginal bleeding or vaginal discharge. No blood in her stool. No blood in her urine. No dysuria. No urinary frequency. No urinary urgency. No fevers. She has not IUD in place. No abdominal surgeries in the past per her report. She typically has regular bowel movements."  UA microscopic blood, culture negative.  Referral to urology placed.   ASCUS PAP, HPV negative earlier this year.  Mirena IUD in place, no menses.   NSVD x 2  CT scan 05/16/23 FINDINGS: Lower chest: No acute abnormality.   Hepatobiliary: No solid liver abnormality is seen. Hepatic steatosis. No gallstones, gallbladder wall thickening, or biliary dilatation.   Pancreas: Unremarkable. No pancreatic ductal dilatation or surrounding inflammatory changes.   Spleen: Normal in size without significant abnormality.   Adrenals/Urinary Tract: Adrenal glands are unremarkable. Kidneys are normal, without renal calculi, solid lesion, or hydronephrosis. Bladder is unremarkable.   Stomach/Bowel: Stomach is within normal limits. Appendix appears normal (series 4, image 60). No evidence of bowel wall thickening, distention, or inflammatory changes. Moderate burden of stool throughout the colon.   Vascular/Lymphatic: No significant vascular findings are  present. No enlarged abdominal or pelvic lymph nodes.   Reproductive: IUD present in the fundal endometrial cavity. Large multicystic mass in the central and posterior pelvis, which appears to arise from the left ovary or adnexa, measuring at least 11.6 x 8.5 x 8.5 cm (series 2, image 68, series 4, image 80). Corpus luteum in the right ovary (series 2, image 81).   Other: No abdominal wall hernia or abnormality. Small volume free fluid in the pelvis (series 2, image 68).   Musculoskeletal: No acute or significant osseous findings.   IMPRESSION: 1. Large multicystic mass in the central and posterior pelvis, which appears to arise from the left ovary or adnexa, measuring at least 11.6 x 8.5 x 8.5 cm. 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 Pelvic ultrasound and contrast enhanced MRI may be helpful for further assessment. 2. Small volume free fluid in the pelvis, nonspecific. 3. Normal appendix. 4. Hepatic steatosis.  Pelvic US 05/17/23 FINDINGS: Uterus   Measurements: 8.3 x 3.3 x 4.6 cm = volume: 65.6 mL. No fibroids or other mass visualized.   Endometrium   Thickness: 4 mm.  IUD noted within the uterus.   Right ovary   Measurements: 3.7 x 1.9 x 2.1 cm = volume: 7.72 mL. Small hypoechoic structure with diffuse internal echoes within the right ovary measures 1.6 x 1.0 x 1.5 cm.   Left ovary   Measurements: 13.4 x 6.8 x 9.7 cm = volume: 460.6 mL. There is a large complex multiloculated cystic mass within the left ovary which measures 7.9 x 6.9 x 9.1 cm. This contains multiple areas of thickened and internal septation which measures  up to 4 mm in thickness. There is increased blood flow localizing to the areas of internal septation. Low level internal echoes identified within the cystic components of this mass.   Other findings   Trace free fluid noted within the pelvis.    IMPRESSION: 1. There is a large complex multiloculated cystic mass within the left ovary which measures 7.9 x 6.9 x 9.1 cm. This contains multiple areas of thickened and internal septation which measures up to 4 mm in thickness. There is increased blood flow localizing to the areas of internal septation. Low level internal echoes identified within the cystic components of this mass. 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 Further evaluation with MRI of the pelvis with and without contrast is advised. Additionally referral to gynecologic oncology is recommended for further management. 2. Small hypoechoic structure with diffuse internal echoes within the right ovary measures 1.6 x 1.0 x 1.5 cm. This is favored to represent a small hemorrhagic cyst. 3. IUD noted within the uterus. 4. Trace free fluid noted within the pelvis.    Problem List: Patient Active Problem List   Diagnosis Date Noted   Right lower quadrant abdominal pain 05/16/2023   Ovarian mass 05/16/2023   Well woman exam with routine gynecological exam 03/23/2023   History of iron deficiency anemia 03/23/2023   Raynaud phenomenon 04/19/2021   Irregular heartbeat 04/19/2021   Bradycardia 04/19/2021   IUD (intrauterine device) in place 04/01/2019    Past Medical History: Past Medical History:  Diagnosis Date   History of chicken pox    History of seizure    as a child    Past Surgical History: Past Surgical History:  Procedure Laterality Date   COLPOSCOPY  2007      OB History:  OB History  Gravida Para Term Preterm AB Living  2 2 2     2   SAB IAB Ectopic Multiple Live Births          2    # Outcome Date GA Lbr Len/2nd Weight Sex Type Anes PTL Lv  2 Term 01/10/11          1 Term 02/18/09            Family History: Family History  Problem Relation Age of Onset   Psoriasis Mother    Sleep apnea Mother    Hyperlipidemia Father    Heart disease Maternal Grandfather        by-pass  surgery   Diabetes Paternal Aunt    Breast cancer Paternal Aunt        60's?    Social History: Social History   Socioeconomic History   Marital status: Married    Spouse name: Kelly Woods)   Number of children: 2   Years of education: Bachelors degree   Highest education level: Not on file  Occupational History   Not on file  Tobacco Use   Smoking status: Never   Smokeless tobacco: Never  Vaping Use   Vaping status: Never Used  Substance and Sexual Activity   Alcohol use: Yes    Comment: 2 drinks a week or so   Drug use: Never   Sexual activity: Yes    Birth control/protection: I.U.D.    Comment: IUD - about 5 years ago  Other Topics Concern   Not on file  Social History Narrative   04/01/19   From: [place] originally    Living: with husband Kelly Fearing and 2 kids   Work: [employer]  Family: 2 kids - Government social research officer and Ava      Enjoys: running - distance up to 1/2 marathon, hiking, geocashing       Exercise: running   Diet: cooks at home, reasonably well - overall doing well      Safety   Seat belts: Yes    Guns: No   Safe in relationships: Yes    Social Determinants of Corporate investment banker Strain: Low Risk  (04/01/2019)   Overall Financial Resource Strain (CARDIA)    Difficulty of Paying Living Expenses: Not hard at all  Food Insecurity: Not on file  Transportation Needs: Not on file  Physical Activity: Not on file  Stress: Not on file  Social Connections: Not on file  Intimate Partner Violence: Not on file    Allergies: No Known Allergies  Current Medications: Current Outpatient Medications  Medication Sig Dispense Refill   Levonorgestrel (MIRENA, 52 MG, IU) by Intrauterine route.     Multiple Vitamins-Minerals (MULTIVITAMIN ADULT PO) Take by mouth.     No current facility-administered medications for this visit.    Review of Systems General: negative for, fevers, chills, fatigue, changes in sleep, changes in weight or appetite Skin: negative  for changes in color, texture, moles or lesions Eyes: negative for, changes in vision, pain, diplopia HEENT: negative for, change in hearing, pain, discharge, tinnitus, vertigo, voice changes, sore throat, neck masses Pulmonary: negative for, dyspnea, orthopnea, productive cough Cardiac: negative for, palpitations, syncope, pain, discomfort, pressure Gastrointestinal: negative for, dysphagia, nausea, vomiting, jaundice, pain, constipation, diarrhea, hematemesis, hematochezia Genitourinary/Sexual: negative for, dysuria, discharge, hesitancy, nocturia, retention, stones, infections, STD's, incontinence Ob/Gyn: negative for, irregular bleeding, pain Musculoskeletal: negative for, pain, stiffness, swelling, range of motion limitation Hematology: negative for, easy bruising, bleeding Neurologic/Psych: negative for, headaches, seizures, paralysis, weakness, tremor, change in gait, change in sensation, mood swings, depression, anxiety, change in memory  Objective:  Physical Examination:  BP 103/71   Pulse (!) 55   Temp (!) 96.6 F (35.9 C)   Resp 20   Wt 131 lb 12.8 oz (59.8 kg)   SpO2 100%   BMI 20.95 kg/m    ECOG Performance Status: 1 - Symptomatic but completely ambulatory  General appearance: alert, cooperative, and appears stated age HEENT:PERRLA and thyroid without masses Lymph node survey: non-palpable, axillary, inguinal, supraclavicular Cardiovascular: regular rate and rhythm, no murmurs or gallops Respiratory: normal air entry, lungs clear to auscultation and no rales, rhonchi or wheezing Abdomen: soft, non-tender, without masses or organomegaly, no hernias, and well healed incision Back: inspection of back is normal Extremities: extremities normal, atraumatic, no cyanosis or edema Skin exam - normal coloration and turgor, no rashes, no suspicious skin lesions noted. Neurological exam reveals alert, oriented, normal speech, no focal findings or movement disorder  noted.  Pelvic: exam chaperoned by nurse;  Vulva: normal appearing vulva with no masses, tenderness or lesions; Vagina: normal vagina; Adnexa: fullness; Uterus: normal; Cervix: IUD in place; Rectal: mass in sacral hollow  Lab Review Lab Results  Component Value Date   WBC 5.3 05/16/2023   HGB 12.5 05/16/2023   HCT 38.8 05/16/2023   MCV 87 05/16/2023   PLT 185 05/16/2023      Chemistry      Component Value Date/Time   NA 140 05/16/2023 0956   K 5.0 05/16/2023 0956   CL 103 05/16/2023 0956   CO2 25 05/16/2023 0956   BUN 11 05/16/2023 0956   CREATININE 0.94 05/16/2023 0956  Component Value Date/Time   CALCIUM 9.8 05/16/2023 0956   ALKPHOS 99 05/16/2023 0956   AST 24 05/16/2023 0956   ALT 16 05/16/2023 0956   BILITOT 0.7 05/16/2023 0956         Assessment:  Kelly Woods is a 47 y.o. P2 female diagnosed with painful 11 cm multicystic left ovarian mass in the posterior pelvis. No evidence of metastatic disease on CT imaging.   ASCUS PAP, HPV negative earlier this year.  Cervix appears normal.  IUD in place.   Medical co-morbidities complicating care: Raynaud's bradycardia (she is a runner), irregular heart beat occasionally (PVCs).  Plan:   Problem List Items Addressed This Visit       Other   Right lower quadrant abdominal pain   Ovarian mass - Primary    We discussed options for management including  surgery to remove the mass. Will obtain CEA, CA125, HE4, CA19-9.  Will repeat UA to see if blood still present, last UA had epithelial cells too suggesting vaginal contamination.  Plan for laparoscopic removal of ovary and tube with the mass and hysterectomy (per her choice) as well as contralateral tube.  If benign frozen will leave one ovary in place.  If cancer will remove both ovaries and perform surgical staging to possibly include pelvic and PA lymph node biopsies, omentectomy, possible bowel resection.  Date of surgery to be determined.    The risks of  surgery were discussed in detail and she understands these to include infection; wound separation; hernia; vaginal cuff separation, injury to adjacent organs such as bowel, bladder, blood vessels, ureters and nerves; possible ostomy; bleeding which may require blood transfusion; anesthesia risk; thromboembolic events; possible death; unforeseen complications; possible need for re-exploration; medical complications such as heart attack, stroke, pleural effusion and pneumonia; and, if staging performed the risk of lymphedema and lymphocyst.  The patient will receive DVT and antibiotic prophylaxis as indicated.  She voiced a clear understanding.  She had the opportunity to ask questions and written informed consent was obtained today.    She will delay screening colonoscopy in view of pelvic mass.   The patient's diagnosis, an outline of the further diagnostic and laboratory studies which will be required, the recommendation, and alternatives were discussed.  All questions were answered to the patient's satisfaction.  A total of 60 minutes were spent with the patient/family today; 50% was spent in education, counseling and coordination of care for pelvic mass.    Leida Lauth, MD    CC:  Glori Luis, MD [address] [place],  [place] [address] [phone]

## 2023-05-24 NOTE — Progress Notes (Signed)
Gynecologic Oncology Consult Visit   Referring Provider: Dr Birdie Sons  Chief Concern: Large pelvic mass  Subjective:  Kelly Woods is a 47 y.o. female who is seen in consultation from Dr. Birdie Sons for large pelvic mass.   Saw Dr Birdie Sons 05/11/23. "Patient reports abdominal pain starting 05/13/2023. Felt a twisty feeling in her lower abdomen at bedtime and was quite uncomfortable overnight. Laying down made it worse. She is more comfortable sitting up. Bothers her when she is up moving around.She did have some diarrhea though she notes this following taking a laxative so she is not sure if the laxative cause diarrhea or for that is related to her abdominal pain. No nausea or vomiting. No vaginal bleeding or vaginal discharge. No blood in her stool. No blood in her urine. No dysuria. No urinary frequency. No urinary urgency. No fevers. She has not IUD in place. No abdominal surgeries in the past per her report. She typically has regular bowel movements."  UA microscopic blood, culture negative.  Referral to urology placed.   ASCUS PAP, HPV negative earlier this year.  Mirena IUD in place, no menses.   NSVD x 2  CT scan 05/16/23 FINDINGS: Lower chest: No acute abnormality.   Hepatobiliary: No solid liver abnormality is seen. Hepatic steatosis. No gallstones, gallbladder wall thickening, or biliary dilatation.   Pancreas: Unremarkable. No pancreatic ductal dilatation or surrounding inflammatory changes.   Spleen: Normal in size without significant abnormality.   Adrenals/Urinary Tract: Adrenal glands are unremarkable. Kidneys are normal, without renal calculi, solid lesion, or hydronephrosis. Bladder is unremarkable.   Stomach/Bowel: Stomach is within normal limits. Appendix appears normal (series 4, image 60). No evidence of bowel wall thickening, distention, or inflammatory changes. Moderate burden of stool throughout the colon.   Vascular/Lymphatic: No significant vascular  findings are present. No enlarged abdominal or pelvic lymph nodes.   Reproductive: IUD present in the fundal endometrial cavity. Large multicystic mass in the central and posterior pelvis, which appears to arise from the left ovary or adnexa, measuring at least 11.6 x 8.5 x 8.5 cm (series 2, image 68, series 4, image 80). Corpus luteum in the right ovary (series 2, image 83).   Other: No abdominal wall hernia or abnormality. Small volume free fluid in the pelvis (series 2, image 68).   Musculoskeletal: No acute or significant osseous findings.   IMPRESSION: 1. Large multicystic mass in the central and posterior pelvis, which appears to arise from the left ovary or adnexa, measuring at least 11.6 x 8.5 x 8.5 cm. 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 Pelvic ultrasound and contrast enhanced MRI may be helpful for further assessment. 2. Small volume free fluid in the pelvis, nonspecific. 3. Normal appendix. 4. Hepatic steatosis.  Pelvic US 05/17/23 FINDINGS: Uterus   Measurements: 8.3 x 3.3 x 4.6 cm = volume: 65.6 mL. No fibroids or other mass visualized.   Endometrium   Thickness: 4 mm.  IUD noted within the uterus.   Right ovary   Measurements: 3.7 x 1.9 x 2.1 cm = volume: 7.72 mL. Small hypoechoic structure with diffuse internal echoes within the right ovary measures 1.6 x 1.0 x 1.5 cm.   Left ovary   Measurements: 13.4 x 6.8 x 9.7 cm = volume: 460.6 mL. There is a large complex multiloculated cystic mass within the left ovary which measures 7.9 x 6.9 x 9.1 cm. This contains multiple areas of thickened and internal septation which  measures up to 4 mm in thickness. There is increased blood flow localizing to the areas of internal septation. Low level internal echoes identified within the cystic components of this mass.   Other findings   Trace free fluid noted within the  pelvis.   IMPRESSION: 1. There is a large complex multiloculated cystic mass within the left ovary which measures 7.9 x 6.9 x 9.1 cm. This contains multiple areas of thickened and internal septation which measures up to 4 mm in thickness. There is increased blood flow localizing to the areas of internal septation. Low level internal echoes identified within the cystic components of this mass. 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 Further evaluation with MRI of the pelvis with and without contrast is advised. Additionally referral to gynecologic oncology is recommended for further management. 2. Small hypoechoic structure with diffuse internal echoes within the right ovary measures 1.6 x 1.0 x 1.5 cm. This is favored to represent a small hemorrhagic cyst. 3. IUD noted within the uterus. 4. Trace free fluid noted within the pelvis.    Problem List: Patient Active Problem List   Diagnosis Date Noted   Right lower quadrant abdominal pain 05/16/2023   Ovarian mass 05/16/2023   Well woman exam with routine gynecological exam 03/23/2023   History of iron deficiency anemia 03/23/2023   Raynaud phenomenon 04/19/2021   Irregular heartbeat 04/19/2021   Bradycardia 04/19/2021   IUD (intrauterine device) in place 04/01/2019    Past Medical History: Past Medical History:  Diagnosis Date   History of chicken pox    History of seizure    as a child    Past Surgical History: Past Surgical History:  Procedure Laterality Date   COLPOSCOPY  2007      OB History:  OB History  Gravida Para Term Preterm AB Living  2 2 2     2   SAB IAB Ectopic Multiple Live Births          2    # Outcome Date GA Lbr Len/2nd Weight Sex Type Anes PTL Lv  2 Term 01/10/11          1 Term 02/18/09            Family History: Family History  Problem Relation Age of Onset   Psoriasis Mother    Sleep apnea Mother    Hyperlipidemia Father    Heart disease Maternal Grandfather         by-pass surgery   Diabetes Paternal Aunt    Breast cancer Paternal Aunt        84's?    Social History: Social History   Socioeconomic History   Marital status: Married    Spouse name: Kelly Woods Rosanne Ashing)   Number of children: 2   Years of education: Bachelors degree   Highest education level: Not on file  Occupational History   Not on file  Tobacco Use   Smoking status: Never   Smokeless tobacco: Never  Vaping Use   Vaping status: Never Used  Substance and Sexual Activity   Alcohol use: Yes    Comment: 2 drinks a week or so   Drug use: Never   Sexual activity: Yes    Birth control/protection: I.U.D.    Comment: IUD - about 5 years ago  Other Topics Concern   Not on file  Social History Narrative   04/01/19   From: [place] originally    Living: with husband Kelly Woods and 2 kids   Work: [employer]  Family: 2 kids - Government social research officer and Ava      Enjoys: running - distance up to 1/2 marathon, hiking, geocashing       Exercise: running   Diet: cooks at home, reasonably well - overall doing well      Safety   Seat belts: Yes    Guns: No   Safe in relationships: Yes    Social Determinants of Corporate investment banker Strain: Low Risk  (04/01/2019)   Overall Financial Resource Strain (CARDIA)    Difficulty of Paying Living Expenses: Not hard at all  Food Insecurity: Not on file  Transportation Needs: Not on file  Physical Activity: Not on file  Stress: Not on file  Social Connections: Not on file  Intimate Partner Violence: Not on file    Allergies: No Known Allergies  Current Medications: Current Outpatient Medications  Medication Sig Dispense Refill   Levonorgestrel (MIRENA, 52 MG, IU) by Intrauterine route.     Multiple Vitamins-Minerals (MULTIVITAMIN ADULT PO) Take by mouth.     No current facility-administered medications for this visit.    Review of Systems General: negative for, fevers, chills, fatigue, changes in sleep, changes in weight or appetite Skin:  negative for changes in color, texture, moles or lesions Eyes: negative for, changes in vision, pain, diplopia HEENT: negative for, change in hearing, pain, discharge, tinnitus, vertigo, voice changes, sore throat, neck masses Pulmonary: negative for, dyspnea, orthopnea, productive cough Cardiac: negative for, palpitations, syncope, pain, discomfort, pressure Gastrointestinal: negative for, dysphagia, nausea, vomiting, jaundice, pain, constipation, diarrhea, hematemesis, hematochezia Genitourinary/Sexual: negative for, dysuria, discharge, hesitancy, nocturia, retention, stones, infections, STD's, incontinence Ob/Gyn: negative for, irregular bleeding, pain Musculoskeletal: negative for, pain, stiffness, swelling, range of motion limitation Hematology: negative for, easy bruising, bleeding Neurologic/Psych: negative for, headaches, seizures, paralysis, weakness, tremor, change in gait, change in sensation, mood swings, depression, anxiety, change in memory  Objective:  Physical Examination:  BP 103/71   Pulse (!) 55   Temp (!) 96.6 F (35.9 C)   Resp 20   Wt 131 lb 12.8 oz (59.8 kg)   SpO2 100%   BMI 20.95 kg/m    ECOG Performance Status: 1 - Symptomatic but completely ambulatory  General appearance: alert, cooperative, and appears stated age HEENT:PERRLA and thyroid without masses Lymph node survey: non-palpable, axillary, inguinal, supraclavicular Cardiovascular: regular rate and rhythm, no murmurs or gallops Respiratory: normal air entry, lungs clear to auscultation and no rales, rhonchi or wheezing Abdomen: soft, non-tender, without masses or organomegaly, no hernias, and well healed incision Back: inspection of back is normal Extremities: extremities normal, atraumatic, no cyanosis or edema Skin exam - normal coloration and turgor, no rashes, no suspicious skin lesions noted. Neurological exam reveals alert, oriented, normal speech, no focal findings or movement disorder  noted.  Pelvic: exam chaperoned by nurse;  Vulva: normal appearing vulva with no masses, tenderness or lesions; Vagina: normal vagina; Adnexa: fullness; Uterus: normal; Cervix: IUD in place; Rectal: mass in sacral hollow  Lab Review Lab Results  Component Value Date   WBC 5.3 05/16/2023   HGB 12.5 05/16/2023   HCT 38.8 05/16/2023   MCV 87 05/16/2023   PLT 185 05/16/2023      Chemistry      Component Value Date/Time   NA 140 05/16/2023 0956   K 5.0 05/16/2023 0956   CL 103 05/16/2023 0956   CO2 25 05/16/2023 0956   BUN 11 05/16/2023 0956   CREATININE 0.94 05/16/2023 0956  Component Value Date/Time   CALCIUM 9.8 05/16/2023 0956   ALKPHOS 99 05/16/2023 0956   AST 24 05/16/2023 0956   ALT 16 05/16/2023 0956   BILITOT 0.7 05/16/2023 0956         Assessment:  Bliss Behnke is a 47 y.o. P2 female diagnosed with painful 11 cm multicystic left ovarian mass in the posterior pelvis. No evidence of metastatic disease on CT imaging.   ASCUS PAP, HPV negative earlier this year.  Cervix appears normal.  IUD in place.   Medical co-morbidities complicating care: Raynaud's bradycardia (she is a runner), irregular heart beat occasionally (PVCs).  Plan:   Problem List Items Addressed This Visit       Other   Right lower quadrant abdominal pain   Ovarian mass - Primary    We discussed options for management including  surgery to remove the mass. Will obtain CEA, CA125, HE4, CA19-9.  Will repeat UA to see if blood still present, last UA had epithelial cells too suggesting vaginal contamination.  Plan for laparoscopic removal of ovary and tube with the mass and hysterectomy (per her choice) as well as contralateral tube.  If benign frozen will leave one ovary in place.  If cancer will remove both ovaries and perform surgical staging to possibly include pelvic and PA lymph node biopsies, omentectomy, possible bowel resection.  Date of surgery to be determined.    The risks of  surgery were discussed in detail and she understands these to include infection; wound separation; hernia; vaginal cuff separation, injury to adjacent organs such as bowel, bladder, blood vessels, ureters and nerves; possible ostomy; bleeding which may require blood transfusion; anesthesia risk; thromboembolic events; possible death; unforeseen complications; possible need for re-exploration; medical complications such as heart attack, stroke, pleural effusion and pneumonia; and, if staging performed the risk of lymphedema and lymphocyst.  The patient will receive DVT and antibiotic prophylaxis as indicated.  She voiced a clear understanding.  She had the opportunity to ask questions and written informed consent was obtained today.    She will delay screening colonoscopy in view of pelvic mass.   The patient's diagnosis, an outline of the further diagnostic and laboratory studies which will be required, the recommendation, and alternatives were discussed.  All questions were answered to the patient's satisfaction.  A total of 60 minutes were spent with the patient/family today; 50% was spent in education, counseling and coordination of care for pelvic mass.    Leida Lauth, MD    CC:  Glori Luis, MD [address] [place],  [place] [address] [phone]

## 2023-05-24 NOTE — Progress Notes (Signed)
Met with patient to review pre-operative teaching for planned surgery scheduled for June 21, 2023. Pre-admit phone screen appointment discussed. Teaching included but not limited to labs, bowel prep and dietary restrictions, surgical location, pain scales/management, diligent peri care, guidelines to prevent constipation, and importance of early ambulation post operatively and follow-up care. Written instructions for pre-op and post-op care, contact information for questions and concerns provided to patient. Patient able to repeat instructions to nursing staff. No further questions at this time. Patient agrees with plan to proceed with scheduled procedure.

## 2023-05-24 NOTE — H&P (Signed)
Gynecologic Oncology H&P   Referring Provider: Dr Birdie Sons  Chief Concern: Large pelvic mass  Subjective:  Kelly Woods is a 47 y.o. female who is seen in consultation from Dr. Birdie Sons for large pelvic mass.   Saw Dr Birdie Sons 05/11/23. "Patient reports abdominal pain starting 05/13/2023. Felt a twisty feeling in her lower abdomen at bedtime and was quite uncomfortable overnight. Laying down made it worse. She is more comfortable sitting up. Bothers her when she is up moving around.She did have some diarrhea though she notes this following taking a laxative so she is not sure if the laxative cause diarrhea or for that is related to her abdominal pain. No nausea or vomiting. No vaginal bleeding or vaginal discharge. No blood in her stool. No blood in her urine. No dysuria. No urinary frequency. No urinary urgency. No fevers. She has not IUD in place. No abdominal surgeries in the past per her report. She typically has regular bowel movements."  UA microscopic blood, culture negative.  Referral to urology placed.   ASCUS PAP, HPV negative earlier this year.  Mirena IUD in place, no menses.   NSVD x 2  CT scan 05/16/23 FINDINGS: Lower chest: No acute abnormality.   Hepatobiliary: No solid liver abnormality is seen. Hepatic steatosis. No gallstones, gallbladder wall thickening, or biliary dilatation.   Pancreas: Unremarkable. No pancreatic ductal dilatation or surrounding inflammatory changes.   Spleen: Normal in size without significant abnormality.   Adrenals/Urinary Tract: Adrenal glands are unremarkable. Kidneys are normal, without renal calculi, solid lesion, or hydronephrosis. Bladder is unremarkable.   Stomach/Bowel: Stomach is within normal limits. Appendix appears normal (series 4, image 60). No evidence of bowel wall thickening, distention, or inflammatory changes. Moderate burden of stool throughout the colon.   Vascular/Lymphatic: No significant vascular findings are  present. No enlarged abdominal or pelvic lymph nodes.   Reproductive: IUD present in the fundal endometrial cavity. Large multicystic mass in the central and posterior pelvis, which appears to arise from the left ovary or adnexa, measuring at least 11.6 x 8.5 x 8.5 cm (series 2, image 68, series 4, image 80). Corpus luteum in the right ovary (series 2, image 81).   Other: No abdominal wall hernia or abnormality. Small volume free fluid in the pelvis (series 2, image 68).   Musculoskeletal: No acute or significant osseous findings.   IMPRESSION: 1. Large multicystic mass in the central and posterior pelvis, which appears to arise from the left ovary or adnexa, measuring at least 11.6 x 8.5 x 8.5 cm. 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 Pelvic ultrasound and contrast enhanced MRI may be helpful for further assessment. 2. Small volume free fluid in the pelvis, nonspecific. 3. Normal appendix. 4. Hepatic steatosis.  Pelvic US 05/17/23 FINDINGS: Uterus   Measurements: 8.3 x 3.3 x 4.6 cm = volume: 65.6 mL. No fibroids or other mass visualized.   Endometrium   Thickness: 4 mm.  IUD noted within the uterus.   Right ovary   Measurements: 3.7 x 1.9 x 2.1 cm = volume: 7.72 mL. Small hypoechoic structure with diffuse internal echoes within the right ovary measures 1.6 x 1.0 x 1.5 cm.   Left ovary   Measurements: 13.4 x 6.8 x 9.7 cm = volume: 460.6 mL. There is a large complex multiloculated cystic mass within the left ovary which measures 7.9 x 6.9 x 9.1 cm. This contains multiple areas of thickened and internal septation which measures  up to 4 mm in thickness. There is increased blood flow localizing to the areas of internal septation. Low level internal echoes identified within the cystic components of this mass.   Other findings   Trace free fluid noted within the pelvis.    IMPRESSION: 1. There is a large complex multiloculated cystic mass within the left ovary which measures 7.9 x 6.9 x 9.1 cm. This contains multiple areas of thickened and internal septation which measures up to 4 mm in thickness. There is increased blood flow localizing to the areas of internal septation. Low level internal echoes identified within the cystic components of this mass. 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 Further evaluation with MRI of the pelvis with and without contrast is advised. Additionally referral to gynecologic oncology is recommended for further management. 2. Small hypoechoic structure with diffuse internal echoes within the right ovary measures 1.6 x 1.0 x 1.5 cm. This is favored to represent a small hemorrhagic cyst. 3. IUD noted within the uterus. 4. Trace free fluid noted within the pelvis.    Problem List: Patient Active Problem List   Diagnosis Date Noted   Right lower quadrant abdominal pain 05/16/2023   Ovarian mass 05/16/2023   Well woman exam with routine gynecological exam 03/23/2023   History of iron deficiency anemia 03/23/2023   Raynaud phenomenon 04/19/2021   Irregular heartbeat 04/19/2021   Bradycardia 04/19/2021   IUD (intrauterine device) in place 04/01/2019    Past Medical History: Past Medical History:  Diagnosis Date   History of chicken pox    History of seizure    as a child    Past Surgical History: Past Surgical History:  Procedure Laterality Date   COLPOSCOPY  2007      OB History:  OB History  Gravida Para Term Preterm AB Living  2 2 2     2   SAB IAB Ectopic Multiple Live Births          2    # Outcome Date GA Lbr Len/2nd Weight Sex Type Anes PTL Lv  2 Term 01/10/11          1 Term 02/18/09            Family History: Family History  Problem Relation Age of Onset   Psoriasis Mother    Sleep apnea Mother    Hyperlipidemia Father    Heart disease Maternal Grandfather        by-pass  surgery   Diabetes Paternal Aunt    Breast cancer Paternal Aunt        60's?    Social History: Social History   Socioeconomic History   Marital status: Married    Spouse name: Fayrene Fearing Rosanne Ashing)   Number of children: 2   Years of education: Bachelors degree   Highest education level: Not on file  Occupational History   Not on file  Tobacco Use   Smoking status: Never   Smokeless tobacco: Never  Vaping Use   Vaping status: Never Used  Substance and Sexual Activity   Alcohol use: Yes    Comment: 2 drinks a week or so   Drug use: Never   Sexual activity: Yes    Birth control/protection: I.U.D.    Comment: IUD - about 5 years ago  Other Topics Concern   Not on file  Social History Narrative   04/01/19   From: [place] originally    Living: with husband Fayrene Fearing and 2 kids   Work: [employer]  Family: 2 kids - Government social research officer and Ava      Enjoys: running - distance up to 1/2 marathon, hiking, geocashing       Exercise: running   Diet: cooks at home, reasonably well - overall doing well      Safety   Seat belts: Yes    Guns: No   Safe in relationships: Yes    Social Determinants of Corporate investment banker Strain: Low Risk  (04/01/2019)   Overall Financial Resource Strain (CARDIA)    Difficulty of Paying Living Expenses: Not hard at all  Food Insecurity: Not on file  Transportation Needs: Not on file  Physical Activity: Not on file  Stress: Not on file  Social Connections: Not on file  Intimate Partner Violence: Not on file    Allergies: No Known Allergies  Current Medications: Current Outpatient Medications  Medication Sig Dispense Refill   Levonorgestrel (MIRENA, 52 MG, IU) by Intrauterine route.     Multiple Vitamins-Minerals (MULTIVITAMIN ADULT PO) Take by mouth.     No current facility-administered medications for this visit.    Review of Systems General: negative for, fevers, chills, fatigue, changes in sleep, changes in weight or appetite Skin: negative  for changes in color, texture, moles or lesions Eyes: negative for, changes in vision, pain, diplopia HEENT: negative for, change in hearing, pain, discharge, tinnitus, vertigo, voice changes, sore throat, neck masses Pulmonary: negative for, dyspnea, orthopnea, productive cough Cardiac: negative for, palpitations, syncope, pain, discomfort, pressure Gastrointestinal: negative for, dysphagia, nausea, vomiting, jaundice, pain, constipation, diarrhea, hematemesis, hematochezia Genitourinary/Sexual: negative for, dysuria, discharge, hesitancy, nocturia, retention, stones, infections, STD's, incontinence Ob/Gyn: negative for, irregular bleeding, pain Musculoskeletal: negative for, pain, stiffness, swelling, range of motion limitation Hematology: negative for, easy bruising, bleeding Neurologic/Psych: negative for, headaches, seizures, paralysis, weakness, tremor, change in gait, change in sensation, mood swings, depression, anxiety, change in memory  Objective:  Physical Examination:  BP 103/71   Pulse (!) 55   Temp (!) 96.6 F (35.9 C)   Resp 20   Wt 131 lb 12.8 oz (59.8 kg)   SpO2 100%   BMI 20.95 kg/m    ECOG Performance Status: 1 - Symptomatic but completely ambulatory  General appearance: alert, cooperative, and appears stated age HEENT:PERRLA and thyroid without masses Lymph node survey: non-palpable, axillary, inguinal, supraclavicular Cardiovascular: regular rate and rhythm, no murmurs or gallops Respiratory: normal air entry, lungs clear to auscultation and no rales, rhonchi or wheezing Abdomen: soft, non-tender, without masses or organomegaly, no hernias, and well healed incision Back: inspection of back is normal Extremities: extremities normal, atraumatic, no cyanosis or edema Skin exam - normal coloration and turgor, no rashes, no suspicious skin lesions noted. Neurological exam reveals alert, oriented, normal speech, no focal findings or movement disorder  noted.  Pelvic: exam chaperoned by nurse;  Vulva: normal appearing vulva with no masses, tenderness or lesions; Vagina: normal vagina; Adnexa: fullness; Uterus: normal; Cervix: IUD in place; Rectal: mass in sacral hollow  Lab Review Lab Results  Component Value Date   WBC 5.3 05/16/2023   HGB 12.5 05/16/2023   HCT 38.8 05/16/2023   MCV 87 05/16/2023   PLT 185 05/16/2023      Chemistry      Component Value Date/Time   NA 140 05/16/2023 0956   K 5.0 05/16/2023 0956   CL 103 05/16/2023 0956   CO2 25 05/16/2023 0956   BUN 11 05/16/2023 0956   CREATININE 0.94 05/16/2023 0956  Component Value Date/Time   CALCIUM 9.8 05/16/2023 0956   ALKPHOS 99 05/16/2023 0956   AST 24 05/16/2023 0956   ALT 16 05/16/2023 0956   BILITOT 0.7 05/16/2023 0956         Assessment:  Alyda Megna is a 47 y.o. P2 female diagnosed with painful 11 cm multicystic left ovarian mass in the posterior pelvis. No evidence of metastatic disease on CT imaging.   ASCUS PAP, HPV negative earlier this year.  Cervix appears normal.  IUD in place.   Medical co-morbidities complicating care: Raynaud's bradycardia (she is a runner), irregular heart beat occasionally (PVCs).  Plan:   Problem List Items Addressed This Visit       Other   Right lower quadrant abdominal pain   Ovarian mass - Primary    We discussed options for management including  surgery to remove the mass. Will obtain CEA, CA125, HE4, CA19-9.  Will repeat UA to see if blood still present, last UA had epithelial cells too suggesting vaginal contamination.  Plan for laparoscopic removal of ovary and tube with the mass and hysterectomy (per her choice) as well as contralateral tube.  If benign frozen will leave one ovary in place.  If cancer will remove both ovaries and perform surgical staging to possibly include pelvic and PA lymph node biopsies, omentectomy, possible bowel resection.  Date of surgery to be determined.    The risks of  surgery were discussed in detail and she understands these to include infection; wound separation; hernia; vaginal cuff separation, injury to adjacent organs such as bowel, bladder, blood vessels, ureters and nerves; possible ostomy; bleeding which may require blood transfusion; anesthesia risk; thromboembolic events; possible death; unforeseen complications; possible need for re-exploration; medical complications such as heart attack, stroke, pleural effusion and pneumonia; and, if staging performed the risk of lymphedema and lymphocyst.  The patient will receive DVT and antibiotic prophylaxis as indicated.  She voiced a clear understanding.  She had the opportunity to ask questions and written informed consent was obtained today.    She will delay screening colonoscopy in view of pelvic mass.   The patient's diagnosis, an outline of the further diagnostic and laboratory studies which will be required, the recommendation, and alternatives were discussed.  All questions were answered to the patient's satisfaction.  A total of 60 minutes were spent with the patient/family today; 50% was spent in education, counseling and coordination of care for pelvic mass.    Leida Lauth, MD    CC:  Glori Luis, MD [address] [place],  [place] [address] [phone]

## 2023-05-24 NOTE — Patient Instructions (Addendum)
Surgery is being planned for June 21, 2023. We will arrange a pre-admit telephone visit prior to surgery. We will see you in follow up 4-6 weeks after surgery.    I   Laparoscopy Laparoscopy is a procedure to diagnose diseases in the abdomen. During the procedure, a thin, lighted, pencil-sized instrument called a laparoscope is inserted into the abdomen through an incision. The laparoscope allows your health care provider to look at the organs inside your body. LET [facility] CARE PROVIDER KNOW ABOUT: Any allergies you have. All medicines you are taking, including vitamins, herbs, eye drops, creams, and over-the-counter medicines. Previous problems you or members of your family have had with the use of anesthetics. Any blood disorders you have. Previous surgeries you have had. Medical conditions you have. RISKS AND COMPLICATIONS  Generally, this is a safe procedure. However, problems can occur, which may include: Infection. Bleeding. Damage to other organs. Allergic reaction to the anesthetics used during the procedure. BEFORE THE PROCEDURE Do not eat or drink anything after midnight on the night before the procedure or as directed by your health care provider. Ask your health care provider about: Changing or stopping your regular medicines. Taking medicines such as aspirin and ibuprofen. These medicines can thin your blood. Do not take these medicines before your procedure if your health care provider instructs you not to. Plan to have someone take you home after the procedure. PROCEDURE You may be given a medicine to help you relax (sedative). You will be given a medicine to make you sleep (general anesthetic). Your abdomen will be inflated with a gas. This will make your organs easier to see. Small incisions will be made in your abdomen. A laparoscope and other small instruments will be inserted into the abdomen through the incisions. A tissue sample may be removed  from an organ in the abdomen for examination. The instruments will be removed from the abdomen. The gas will be released. The incisions will be closed with stitches (sutures). AFTER THE PROCEDURE  Your blood pressure, heart rate, breathing rate, and blood oxygen level will be monitored often until the medicines you were given have worn off.   This information is not intended to replace advice given to you by your health care provider. Make sure you discuss any questions you have with your health care provider.                                             Bowel Symptoms After Surgery After gynecologic surgery, women often have temporary changes in bowel function (constipation and gas pain).  Following are tips to help prevent and treat common bowel problems.  It also tells you when to call the doctor.  This is important because some symptoms might be a sign of a more serious bowel problem such as obstruction (bowel blockage).  These problems are rare but can happen after gynecologic surgery.   Besides surgery, what can temporarily affect bowel function? 1. Dietary changes   2. Decreased physical activity   3.Antibiotics   4. Pain medication   How can I prevent constipation (three days or more without a stool)? Include fiber in your diet: whole grains, raw or dried fruits & vegetables, prunes, prune/pear juiceDrink at least 8 glasses of liquid (preferably water) every day Avoid: Gas forming foods such as broccoli, beans, peas, salads, cabbage, sweet potatoes Greasy,  fatty, or fried foods Activity helps bowel function return to normal, walk around the house at least 3-4 times each day for 15 minutes or longer, if tolerated.  Rocking in a rocking chair is preferable to sitting still. Stool softeners: these are not laxatives, but serve to soften the stool to avoid straining.  Take 2-4 times a day until normal bowel function returns         Examples: Colace or generic equivalent (Docusate) Bulk  laxatives: provide a concentrated source of fiber.  They do not stimulate the bowel.  Take 1-2 times each day until normal bowel function return.              Examples: Citrucel, Metamucil, Fiberal, Fibercon   What can I take for "Gas Pains"? Simethicone (Mylicon, Gas-X, Maalox-Gas, Mylanta-Gas) take 3-4 times a day Maalox Regular - take 3-4 times a day Mylanta Regular - take 3-4 times a day   What can I take if I become constipated? Start with stool softeners and add additional laxatives below as needed to have a bowel movement every 1-2 days  Stool softeners 1-2 tablets, 2 times a day Senokot 1-2 tablets, 1-2 times a day Glycerin suppository can soften hard stool take once a day Bisacodyl suppository once a day  Milk of Magnesia 30 mL 1-2 times a day Fleets or tap water enema    What can I do for nausea?  Limit most solid foods for 24-48 hours Continue eating small frequent amounts of liquids and/or bland soft foods Toast, crackers, cooked cereal (grits, cream of wheat, rice) Benadryl: a mild anti-nausea medicine can be obtained without a prescription. May cause drowsiness, especially if taken with narcotic pain medicines Contact provider for prescription nausea medication     What can I do, or take for diarrhea (more than five loose stools per day)? Drink plenty of clear fluids to prevent dehydration May take Kaopectate, Pepto-Bismol, Imodium, or probiotics for 1-2 days Anusol or Preparation-H can be helpful for hemorrhoids and irritated tissue around anus   When should I call the doctor?             CONSTIPATION:  Not relieved after three days following the above program VOMITING: That contains blood, "coffee ground" material More the three times/hour and unable to keep down nausea medication for more than eight hours With dry mouth, dark or strong urine, feeling light-headed, dizzy, or confused With severe abdominal pain or bloating for more than 24 hours DIARRHEA: That  continues for more then 24-48 hours despite treatment That contains blood or tarry material With dry mouth, dark or strong urine, feeling light~headed, dizzy, or confused FEVER: 101 F or higher along with nausea, vomiting, gas pain, diarrhea UNABLE TO: Pass gas from rectum for more than 24 hours Tolerate liquids by mouth for more than 24 hours        Laparoscopic Hysterectomy, Care After Refer to this sheet in the next few weeks. These instructions provide you with information on caring for yourself after your procedure. Your health care provider may also give you more specific instructions. Your treatment has been planned according to current medical practices, but problems sometimes occur. Call your health care provider if you have any problems or questions after your procedure. What can I expect after the procedure? Pain and bruising at the incision sites. You will be given pain medicine to control it. Menopausal symptoms such as hot flashes, night sweats, and insomnia if your ovaries were removed. Sore throat from  the breathing tube that was inserted during surgery. Follow these instructions at home: Only take over-the-counter or prescription medicines for pain, discomfort, or fever as directed by your health care provider. Do not take aspirin. It can cause bleeding. Do not drive when taking pain medicine. Follow your health care provider's advice regarding diet, exercise, lifting, driving, and general activities. Resume your usual diet as directed and allowed. Get plenty of rest and sleep. Do not douche, use tampons, or have sexual intercourse for at least 6 weeks, or until your health care provider gives you permission. Change your bandages (dressings) as directed by your health care provider. Monitor your temperature and notify your health care provider of a fever. Take showers instead of baths for 2-3 weeks. Do not drink alcohol until your health care provider gives you  permission. If you develop constipation, you may take a mild laxative with your health care provider's permission. Bran foods may help with constipation problems. Drinking enough fluids to keep your urine clear or pale yellow may help as well. Try to have someone home with you for 1-2 weeks to help around the house. Keep all of your follow-up appointments as directed by your health care provider. Contact a health care provider if: You have swelling, redness, or increasing pain around your incision sites. You have pus coming from your incision. You notice a bad smell coming from your incision. Your incision breaks open. You feel dizzy or lightheaded. You have pain or bleeding when you urinate. You have persistent diarrhea. You have persistent nausea and vomiting. You have abnormal vaginal discharge. You have a rash. You have any type of abnormal reaction or develop an allergy to your medicine. You have poor pain control with your prescribed medicine. Get help right away if: You have chest pain or shortness of breath. You have severe abdominal pain that is not relieved with pain medicine. You have pain or swelling in your legs. This information is not intended to replace advice given to you by your health care provider. Make sure you discuss any questions you have with your health care provider. Document Released: 08/07/2013 Document Revised: 03/24/2016 Document Reviewed: 05/07/2013 Elsevier Interactive Patient Education  2017 [employer].

## 2023-05-25 LAB — CA 125: Cancer Antigen (CA) 125: 23.2 U/mL (ref 0.0–38.1)

## 2023-05-25 LAB — CEA: CEA: 7.7 ng/mL — ABNORMAL HIGH (ref 0.0–4.7)

## 2023-05-25 LAB — CANCER ANTIGEN 19-9: CA 19-9: 113 U/mL — ABNORMAL HIGH (ref 0–35)

## 2023-05-26 ENCOUNTER — Telehealth: Payer: Self-pay | Admitting: Nurse Practitioner

## 2023-05-26 ENCOUNTER — Other Ambulatory Visit: Payer: Self-pay | Admitting: Family Medicine

## 2023-05-26 DIAGNOSIS — N838 Other noninflammatory disorders of ovary, fallopian tube and broad ligament: Secondary | ICD-10-CM

## 2023-05-26 DIAGNOSIS — R3129 Other microscopic hematuria: Secondary | ICD-10-CM

## 2023-05-26 NOTE — Telephone Encounter (Signed)
Called patient to discuss recent labs and recommendations for surgery. No answer. Left voicemail.

## 2023-05-26 NOTE — Telephone Encounter (Signed)
Spoke to patient. Dr Johnnette Litter and I reviewed tumor markers. Findings suspicious for mucinous cystoadenoma. Timing of surgery is acceptable, however, if available, we could move up. [facility] was not able to see her sooner. Will update patient if availability presents.

## 2023-05-31 ENCOUNTER — Telehealth: Payer: Self-pay | Admitting: *Deleted

## 2023-05-31 NOTE — Telephone Encounter (Signed)
Received a message from endo unit that patient need to reschedule colonoscopy to another date.  Requesting to change colonoscopy to 10/09/2023.  Called endo unit to make the change.  New instructions will be sent

## 2023-06-05 ENCOUNTER — Telehealth: Payer: Self-pay

## 2023-06-05 NOTE — Telephone Encounter (Signed)
Patient called stating she has some concerns about some results. When asked which results she had concerns about patient states "I spoke with NP last week about these results and I seen some more results come through and was concerned about them."

## 2023-06-13 ENCOUNTER — Encounter
Admission: RE | Admit: 2023-06-13 | Discharge: 2023-06-13 | Disposition: A | Discharge status: Home / Self Care | Payer: Managed Care, Other (non HMO) | Source: Ambulatory Visit | Attending: Obstetrics and Gynecology | Admitting: Obstetrics and Gynecology

## 2023-06-13 ENCOUNTER — Other Ambulatory Visit: Payer: Managed Care, Other (non HMO)

## 2023-06-13 VITALS — Ht 66.5 in | Wt 132.3 lb

## 2023-06-13 DIAGNOSIS — Z01818 Encounter for other preprocedural examination: Secondary | ICD-10-CM

## 2023-06-13 HISTORY — DX: Other noninflammatory disorders of ovary, fallopian tube and broad ligament: N83.8

## 2023-06-13 HISTORY — DX: Presence of (intrauterine) contraceptive device: Z97.5

## 2023-06-13 HISTORY — DX: Personal history of diseases of the blood and blood-forming organs and certain disorders involving the immune mechanism: Z86.2

## 2023-06-13 HISTORY — DX: Raynaud's syndrome without gangrene: I73.00

## 2023-06-13 HISTORY — DX: Bradycardia, unspecified: R00.1

## 2023-06-13 HISTORY — DX: Ventricular premature depolarization: I49.3

## 2023-06-13 NOTE — Patient Instructions (Addendum)
Your procedure is scheduled on:06-21-23 Wednesday Report to the Registration Desk on the 1st floor of the [facility].Then proceed to the 2nd floor Surgery Desk To find out your arrival time, please call [phone] between 1PM - 3PM on:06-20-23 Tuesday If your arrival time is 6:00 am, do not arrive before that time as the [facility] entrance doors do not open until 6:00 am.  REMEMBER: Instructions that are not followed completely may result in serious medical risk, up to and including death; or upon the discretion of your surgeon and anesthesiologist your surgery may need to be rescheduled.  Do not eat food OR drink any liquids after midnight the night before surgery.  No gum chewing or hard candies.  One week prior to surgery: Stop Anti-inflammatories (NSAIDS) such as Advil, Aleve, Ibuprofen, Motrin, Naproxen, Naprosyn and Aspirin based products such as Excedrin, Goody's Powder, BC Powder.You may however, take Tylenol if needed for pain up until the day of surgery. Stop ANY OVER THE COUNTER supplements/vitamins NOW (06-13-23) until after surgery (Multivitamin)  Do NOT take any medication the day of surgery  No Alcohol for 24 hours before or after surgery.  No Smoking including e-cigarettes for 24 hours before surgery.  No chewable tobacco products for at least 6 hours before surgery.  No nicotine patches on the day of surgery.  Do not use any "recreational" drugs for at least a week (preferably 2 weeks) before your surgery.  Please be advised that the combination of cocaine and anesthesia may have negative outcomes, up to and including death. If you test positive for cocaine, your surgery will be cancelled.  On the morning of surgery brush your teeth with toothpaste and water, you may rinse your mouth with mouthwash if you wish. Do not swallow any toothpaste or mouthwash.  Use CHG Soap as directed on instruction sheet.  Do not wear jewelry, make-up, hairpins, clips or nail  polish.  Do not wear lotions, powders, or perfumes.   Do not shave body hair from the neck down 48 hours before surgery.  Contact lenses, hearing aids and dentures may not be worn into surgery.  Do not bring valuables to the hospital. [facility] is not responsible for any missing/lost belongings or valuables.  Notify your doctor if there is any change in your medical condition (cold, fever, infection).  Wear comfortable clothing (specific to your surgery type) to the hospital.  After surgery, you can help prevent lung complications by doing breathing exercises.  Take deep breaths and cough every 1-2 hours. Your doctor may order a device called an Incentive Spirometer to help you take deep breaths. When coughing or sneezing, hold a pillow firmly against your incision with both hands. This is called "splinting." Doing this helps protect your incision. It also decreases belly discomfort.  If you are being admitted to the hospital overnight, leave your suitcase in the car. After surgery it may be brought to your room.  In case of increased patient census, it may be necessary for you, the patient, to continue your postoperative care in the Same Day Surgery department.  If you are being discharged the day of surgery, you will not be allowed to drive home. You will need a responsible individual to drive you home and stay with you for 24 hours after surgery.   If you are taking public transportation, you will need to have a responsible individual with you.  Please call the Pre-admissions Testing Dept. at [phone] if you have any questions  about these instructions.  Surgery Visitation Policy:  Patients having surgery or a procedure may have two visitors.  Children under the age of 25 must have an adult with them who is not the patient.     Preparing for Surgery with CHLORHEXIDINE GLUCONATE (CHG) Soap  Chlorhexidine Gluconate (CHG) Soap  o An antiseptic cleaner that kills germs  and bonds with the skin to continue killing germs even after washing  o Used for showering the night before surgery and morning of surgery  Before surgery, you can play an important role by reducing the number of germs on your skin.  CHG (Chlorhexidine gluconate) soap is an antiseptic cleanser which kills germs and bonds with the skin to continue killing germs even after washing.  Please do not use if you have an allergy to CHG or antibacterial soaps. If your skin becomes reddened/irritated stop using the CHG.  1. Shower the NIGHT BEFORE SURGERY and the MORNING OF SURGERY with CHG soap.  2. If you choose to wash your hair, wash your hair first as usual with your normal shampoo.  3. After shampooing, rinse your hair and body thoroughly to remove the shampoo.  4. Use CHG as you would any other liquid soap. You can apply CHG directly to the skin and wash gently with a scrungie or a clean washcloth.  5. Apply the CHG soap to your body only from the neck down. Do not use on open wounds or open sores. Avoid contact with your eyes, ears, mouth, and genitals (private parts). Wash face and genitals (private parts) with your normal soap.  6. Wash thoroughly, paying special attention to the area where your surgery will be performed.  7. Thoroughly rinse your body with warm water.  8. Do not shower/wash with your normal soap after using and rinsing off the CHG soap.  9. Pat yourself dry with a clean towel.  10. Wear clean pajamas to bed the night before surgery.  12. Place clean sheets on your bed the night of your first shower and do not sleep with pets.  13. Shower again with the CHG soap on the day of surgery prior to arriving at the hospital.  14. Do not apply any deodorants/lotions/powders.  15. Please wear clean clothes to the hospital.

## 2023-06-14 ENCOUNTER — Encounter
Admission: RE | Admit: 2023-06-14 | Discharge: 2023-06-14 | Disposition: A | Discharge status: Home / Self Care | Payer: Managed Care, Other (non HMO) | Source: Ambulatory Visit | Attending: Obstetrics and Gynecology | Admitting: Obstetrics and Gynecology

## 2023-06-14 DIAGNOSIS — Z01812 Encounter for preprocedural laboratory examination: Secondary | ICD-10-CM | POA: Diagnosis present

## 2023-06-14 DIAGNOSIS — N838 Other noninflammatory disorders of ovary, fallopian tube and broad ligament: Secondary | ICD-10-CM | POA: Insufficient documentation

## 2023-06-15 ENCOUNTER — Other Ambulatory Visit: Payer: Managed Care, Other (non HMO)

## 2023-06-21 ENCOUNTER — Encounter
Admission: RE | Disposition: A | Discharge status: Home / Self Care | Payer: Self-pay | Source: Home / Self Care | Attending: Obstetrics and Gynecology

## 2023-06-21 ENCOUNTER — Ambulatory Visit: Payer: Managed Care, Other (non HMO) | Admitting: Anesthesiology

## 2023-06-21 ENCOUNTER — Encounter: Payer: Self-pay | Admitting: Obstetrics and Gynecology

## 2023-06-21 ENCOUNTER — Other Ambulatory Visit: Payer: Self-pay

## 2023-06-21 ENCOUNTER — Ambulatory Visit
Admission: RE | Admit: 2023-06-21 | Discharge: 2023-06-21 | Disposition: A | Discharge status: Home / Self Care | Payer: Managed Care, Other (non HMO) | Attending: Obstetrics and Gynecology | Admitting: Obstetrics and Gynecology

## 2023-06-21 DIAGNOSIS — K760 Fatty (change of) liver, not elsewhere classified: Secondary | ICD-10-CM | POA: Diagnosis not present

## 2023-06-21 DIAGNOSIS — N888 Other specified noninflammatory disorders of cervix uteri: Secondary | ICD-10-CM | POA: Diagnosis not present

## 2023-06-21 DIAGNOSIS — Z975 Presence of (intrauterine) contraceptive device: Secondary | ICD-10-CM | POA: Insufficient documentation

## 2023-06-21 DIAGNOSIS — N838 Other noninflammatory disorders of ovary, fallopian tube and broad ligament: Secondary | ICD-10-CM | POA: Insufficient documentation

## 2023-06-21 DIAGNOSIS — R1909 Other intra-abdominal and pelvic swelling, mass and lump: Secondary | ICD-10-CM | POA: Diagnosis present

## 2023-06-21 DIAGNOSIS — Z01818 Encounter for other preprocedural examination: Secondary | ICD-10-CM

## 2023-06-21 DIAGNOSIS — C481 Malignant neoplasm of specified parts of peritoneum: Secondary | ICD-10-CM | POA: Insufficient documentation

## 2023-06-21 HISTORY — PX: ROBOTIC ASSISTED TOTAL HYSTERECTOMY WITH BILATERAL SALPINGO OOPHERECTOMY: SHX6086

## 2023-06-21 LAB — ABO/RH: ABO/RH(D): O POS

## 2023-06-21 LAB — POCT PREGNANCY, URINE: Preg Test, Ur: NEGATIVE

## 2023-06-21 SURGERY — HYSTERECTOMY, TOTAL, ROBOT-ASSISTED, LAPAROSCOPIC, WITH BILATERAL SALPINGO-OOPHORECTOMY
Anesthesia: General | Site: Abdomen

## 2023-06-21 MED ORDER — LIDOCAINE HCL (PF) 2 % IJ SOLN
INTRAMUSCULAR | Status: AC
  Filled 2023-06-21: qty 5

## 2023-06-21 MED ORDER — ONDANSETRON HCL 4 MG/2ML IJ SOLN
INTRAMUSCULAR | Status: DC | PRN
  Administered 2023-06-21: 4 mg via INTRAVENOUS

## 2023-06-21 MED ORDER — ROCURONIUM BROMIDE 100 MG/10ML IV SOLN
INTRAVENOUS | Status: DC | PRN
  Administered 2023-06-21 (×2): 20 mg via INTRAVENOUS
  Administered 2023-06-21: 50 mg via INTRAVENOUS

## 2023-06-21 MED ORDER — ORAL CARE MOUTH RINSE: 15.0000 mL | Freq: Once | OROMUCOSAL | Status: AC

## 2023-06-21 MED ORDER — DEXAMETHASONE SODIUM PHOSPHATE 10 MG/ML IJ SOLN
INTRAMUSCULAR | Status: AC
  Filled 2023-06-21: qty 1

## 2023-06-21 MED ORDER — OXYCODONE-ACETAMINOPHEN 5-325 MG PO TABS
1.0000 | ORAL_TABLET | Freq: Four times a day (QID) | ORAL | 0 refills | Status: DC | PRN
Start: 2023-06-21 — End: 2023-10-09

## 2023-06-21 MED ORDER — PROPOFOL 10 MG/ML IV BOLUS
INTRAVENOUS | Status: DC | PRN
  Administered 2023-06-21: 200 mg via INTRAVENOUS

## 2023-06-21 MED ORDER — ONDANSETRON HCL 4 MG/2ML IJ SOLN
INTRAMUSCULAR | Status: AC
  Filled 2023-06-21: qty 2

## 2023-06-21 MED ORDER — CHLORHEXIDINE GLUCONATE CLOTH 2 % EX PADS
6.0000 | MEDICATED_PAD | Freq: Once | CUTANEOUS | Status: AC
  Administered 2023-06-21: 6 via TOPICAL

## 2023-06-21 MED ORDER — FENTANYL CITRATE (PF) 100 MCG/2ML IJ SOLN
INTRAMUSCULAR | Status: DC | PRN
  Administered 2023-06-21 (×2): 50 ug via INTRAVENOUS

## 2023-06-21 MED ORDER — HEPARIN SODIUM (PORCINE) 5000 UNIT/ML IJ SOLN
INTRAMUSCULAR | Status: AC
  Filled 2023-06-21: qty 1

## 2023-06-21 MED ORDER — ROCURONIUM BROMIDE 10 MG/ML (PF) SYRINGE
PREFILLED_SYRINGE | INTRAVENOUS | Status: AC
  Filled 2023-06-21: qty 10

## 2023-06-21 MED ORDER — GLYCOPYRROLATE 0.2 MG/ML IJ SOLN
INTRAMUSCULAR | Status: DC | PRN
  Administered 2023-06-21: .2 mg via INTRAVENOUS

## 2023-06-21 MED ORDER — CHLORHEXIDINE GLUCONATE 0.12 % MT SOLN
OROMUCOSAL | Status: AC
  Filled 2023-06-21: qty 15

## 2023-06-21 MED ORDER — OXYCODONE HCL 5 MG PO TABS: 5.0000 mg | ORAL_TABLET | Freq: Once | ORAL | Status: DC | PRN

## 2023-06-21 MED ORDER — DROPERIDOL 2.5 MG/ML IJ SOLN
INTRAMUSCULAR | Status: AC
  Filled 2023-06-21: qty 2

## 2023-06-21 MED ORDER — CEFAZOLIN SODIUM-DEXTROSE 2-4 GM/100ML-% IV SOLN
INTRAVENOUS | Status: AC
  Filled 2023-06-21: qty 100

## 2023-06-21 MED ORDER — FAMOTIDINE 20 MG PO TABS
20.0000 mg | ORAL_TABLET | Freq: Once | ORAL | Status: AC
  Administered 2023-06-21: 20 mg via ORAL

## 2023-06-21 MED ORDER — OXYCODONE HCL 5 MG/5ML PO SOLN: 5.0000 mg | Freq: Once | ORAL | Status: DC | PRN

## 2023-06-21 MED ORDER — LACTATED RINGERS IV SOLN: INTRAVENOUS | Status: DC

## 2023-06-21 MED ORDER — CHLORHEXIDINE GLUCONATE 0.12 % MT SOLN
15.0000 mL | Freq: Once | OROMUCOSAL | Status: AC
  Administered 2023-06-21: 15 mL via OROMUCOSAL

## 2023-06-21 MED ORDER — FENTANYL CITRATE (PF) 100 MCG/2ML IJ SOLN
INTRAMUSCULAR | Status: AC
  Filled 2023-06-21: qty 2

## 2023-06-21 MED ORDER — FAMOTIDINE 20 MG PO TABS
ORAL_TABLET | ORAL | Status: AC
  Filled 2023-06-21: qty 1

## 2023-06-21 MED ORDER — EPHEDRINE 5 MG/ML INJ
INTRAVENOUS | Status: AC
  Filled 2023-06-21: qty 5

## 2023-06-21 MED ORDER — KETAMINE HCL 10 MG/ML IJ SOLN
INTRAMUSCULAR | Status: DC | PRN
  Administered 2023-06-21: 25 mg via INTRAVENOUS

## 2023-06-21 MED ORDER — METRONIDAZOLE 500 MG/100ML IV SOLN
500.0000 mg | INTRAVENOUS | Status: AC
  Administered 2023-06-21: 500 mg via INTRAVENOUS
  Filled 2023-06-21: qty 100

## 2023-06-21 MED ORDER — ONDANSETRON 4 MG PO TBDP
4.0000 mg | ORAL_TABLET | Freq: Four times a day (QID) | ORAL | 0 refills | Status: DC | PRN
Start: 2023-06-21 — End: 2023-10-09

## 2023-06-21 MED ORDER — LIDOCAINE HCL (CARDIAC) PF 100 MG/5ML IV SOSY
PREFILLED_SYRINGE | INTRAVENOUS | Status: DC | PRN
  Administered 2023-06-21: 50 mg via INTRAVENOUS

## 2023-06-21 MED ORDER — HEPARIN SODIUM (PORCINE) 5000 UNIT/ML IJ SOLN
5000.0000 [IU] | Freq: Once | INTRAMUSCULAR | Status: AC
  Administered 2023-06-21: 5000 [IU] via SUBCUTANEOUS

## 2023-06-21 MED ORDER — EPHEDRINE SULFATE (PRESSORS) 50 MG/ML IJ SOLN
INTRAMUSCULAR | Status: DC | PRN
  Administered 2023-06-21: 15 mg via INTRAVENOUS

## 2023-06-21 MED ORDER — SUGAMMADEX SODIUM 500 MG/5ML IV SOLN
INTRAVENOUS | Status: DC | PRN
  Administered 2023-06-21: 200 mg via INTRAVENOUS

## 2023-06-21 MED ORDER — KETAMINE HCL 50 MG/5ML IJ SOSY
PREFILLED_SYRINGE | INTRAMUSCULAR | Status: AC
  Filled 2023-06-21: qty 5

## 2023-06-21 MED ORDER — HYDROMORPHONE HCL 1 MG/ML IJ SOLN: 0.2500 mg | INTRAMUSCULAR | Status: DC | PRN

## 2023-06-21 MED ORDER — ACETAMINOPHEN 10 MG/ML IV SOLN
INTRAVENOUS | Status: AC
  Filled 2023-06-21: qty 100

## 2023-06-21 MED ORDER — DROPERIDOL 2.5 MG/ML IJ SOLN
0.6250 mg | Freq: Once | INTRAMUSCULAR | Status: AC
  Administered 2023-06-21: 0.625 mg via INTRAVENOUS

## 2023-06-21 MED ORDER — PROPOFOL 10 MG/ML IV BOLUS
INTRAVENOUS | Status: AC
  Filled 2023-06-21: qty 20

## 2023-06-21 MED ORDER — DEXAMETHASONE SODIUM PHOSPHATE 10 MG/ML IJ SOLN
INTRAMUSCULAR | Status: DC | PRN
  Administered 2023-06-21: 10 mg via INTRAVENOUS

## 2023-06-21 MED ORDER — ATROPINE SULFATE 1 MG/10ML IJ SOSY
PREFILLED_SYRINGE | INTRAMUSCULAR | Status: AC
  Filled 2023-06-21: qty 10

## 2023-06-21 MED ORDER — ACETAMINOPHEN 10 MG/ML IV SOLN
INTRAVENOUS | Status: DC | PRN
  Administered 2023-06-21: 1000 mg via INTRAVENOUS

## 2023-06-21 MED ORDER — KETOROLAC TROMETHAMINE 30 MG/ML IJ SOLN
INTRAMUSCULAR | Status: DC | PRN
  Administered 2023-06-21: 30 mg via INTRAVENOUS

## 2023-06-21 MED ORDER — BUPIVACAINE HCL (PF) 0.5 % IJ SOLN
INTRAMUSCULAR | Status: DC | PRN
  Administered 2023-06-21: 14 mL

## 2023-06-21 MED ORDER — GLYCOPYRROLATE 0.2 MG/ML IJ SOLN
INTRAMUSCULAR | Status: AC
  Filled 2023-06-21: qty 1

## 2023-06-21 MED ORDER — BUPIVACAINE HCL (PF) 0.5 % IJ SOLN
INTRAMUSCULAR | Status: AC
  Filled 2023-06-21: qty 30

## 2023-06-21 MED ORDER — STERILE WATER FOR IRRIGATION IR SOLN
Status: DC | PRN
  Administered 2023-06-21: 500 mL

## 2023-06-21 MED ORDER — ATROPINE SULFATE 1 MG/ML IV SOLN
INTRAVENOUS | Status: DC | PRN
  Administered 2023-06-21: .2 mg via INTRAVENOUS

## 2023-06-21 MED ORDER — IBUPROFEN 600 MG PO TABS
600.0000 mg | ORAL_TABLET | Freq: Four times a day (QID) | ORAL | 0 refills | Status: DC
Start: 2023-06-21 — End: 2023-10-09

## 2023-06-21 MED ORDER — CEFAZOLIN SODIUM-DEXTROSE 2-4 GM/100ML-% IV SOLN
2.0000 g | INTRAVENOUS | Status: AC
  Administered 2023-06-21: 2 g via INTRAVENOUS

## 2023-06-21 MED ORDER — MIDAZOLAM HCL 2 MG/2ML IJ SOLN
INTRAMUSCULAR | Status: AC
  Filled 2023-06-21: qty 2

## 2023-06-21 MED ORDER — KETOROLAC TROMETHAMINE 30 MG/ML IJ SOLN
INTRAMUSCULAR | Status: AC
  Filled 2023-06-21: qty 1

## 2023-06-21 MED ORDER — MIDAZOLAM HCL 2 MG/2ML IJ SOLN
INTRAMUSCULAR | Status: DC | PRN
  Administered 2023-06-21: 2 mg via INTRAVENOUS

## 2023-06-21 SURGICAL SUPPLY — 113 items
ADH SKN CLS APL DERMABOND .7 (GAUZE/BANDAGES/DRESSINGS) ×2
ANCHOR TIS RET SYS 235ML (MISCELLANEOUS) IMPLANT
APL PRP STRL LF DISP 70% ISPRP (MISCELLANEOUS) ×2
BAG DRN RND TRDRP ANRFLXCHMBR (UROLOGICAL SUPPLIES) ×2
BAG LAPAROSCOPIC 12 15 PORT 16 (BASKET) IMPLANT
BAG RETRIEVAL 12/15 (BASKET) ×2
BAG TISS RTRVL C235 10X14 (MISCELLANEOUS)
BAG URINE DRAIN 2000ML AR STRL (UROLOGICAL SUPPLIES) ×2 IMPLANT
BLADE SURG SZ11 CARB STEEL (BLADE) ×2 IMPLANT
CANNULA CAP OBTURATR AIRSEAL 8 (CAP) ×2 IMPLANT
CATH FOLEY 2WAY 5CC 16FR (CATHETERS) ×2
CATH URTH 16FR FL 2W BLN LF (CATHETERS) ×2 IMPLANT
CAUTERY HOOK MNPLR 1.6 DVNC XI (INSTRUMENTS) ×2 IMPLANT
CHLORAPREP W/TINT 26 (MISCELLANEOUS) ×2 IMPLANT
CNTNR URN SCR LID CUP LEK RST (MISCELLANEOUS) ×2 IMPLANT
CONT SPEC 4OZ STRL OR WHT (MISCELLANEOUS) ×2
COUNTER NEEDLE 20/40 LG (NEEDLE) IMPLANT
COVER TIP SHEARS 8 DVNC (MISCELLANEOUS) ×2 IMPLANT
COVER WAND RF STERILE (DRAPES) ×2 IMPLANT
DEFOGGER SCOPE WARMER CLEARIFY (MISCELLANEOUS) ×1 IMPLANT
DERMABOND ADVANCED .7 DNX12 (GAUZE/BANDAGES/DRESSINGS) ×2 IMPLANT
DRAPE ARM DVNC X/XI (DISPOSABLE) ×6 IMPLANT
DRAPE COLUMN DVNC XI (DISPOSABLE) ×2 IMPLANT
DRAPE LAPAROTOMY 100X77 ABD (DRAPES) ×2 IMPLANT
DRAPE ROBOT W/ LEGGING 30X125 (DRAPES) ×2 IMPLANT
DRAPE SHEET LG 3/4 BI-LAMINATE (DRAPES) ×2 IMPLANT
DRAPE UNDER BUTTOCK W/FLU (DRAPES) ×2 IMPLANT
DRESSING SURGICEL FIBRLLR 1X2 (HEMOSTASIS) IMPLANT
DRIVER NDL MEGA SUTCUT DVNCXI (INSTRUMENTS) ×1 IMPLANT
DRIVER NDLE MEGA SUTCUT DVNCXI (INSTRUMENTS) ×2
DRSG SURGICEL FIBRILLAR 1X2 (HEMOSTASIS)
DRSG TELFA 3X4 N-ADH STERILE (GAUZE/BANDAGES/DRESSINGS) IMPLANT
DRSG TELFA 3X8 NADH STRL (GAUZE/BANDAGES/DRESSINGS) ×2 IMPLANT
ELECT BLADE 6 FLAT ULTRCLN (ELECTRODE) ×2 IMPLANT
ELECT CAUTERY BLADE 6.4 (BLADE) ×2 IMPLANT
ELECT CAUTERY BLADE TIP 2.5 (TIP) ×2
ELECT REM PT RETURN 9FT ADLT (ELECTROSURGICAL) ×2
ELECTRODE CAUTERY BLDE TIP 2.5 (TIP) ×1 IMPLANT
ELECTRODE REM PT RTRN 9FT ADLT (ELECTROSURGICAL) ×1 IMPLANT
FORCEPS BPLR 8 MD DVNC XI (FORCEP) ×2 IMPLANT
FORCEPS BPLR FENES DVNC XI (FORCEP) ×2 IMPLANT
GAUZE 4X4 16PLY ~~LOC~~+RFID DBL (SPONGE) ×2 IMPLANT
GAUZE SPONGE 4X4 12PLY STRL (GAUZE/BANDAGES/DRESSINGS) ×2 IMPLANT
GLOVE BIO SURGEON STRL SZ 6.5 (GLOVE) ×12 IMPLANT
GLOVE BIOGEL PI IND STRL 6.5 (GLOVE) ×8 IMPLANT
GLOVE INDICATOR 7.0 STRL GRN (GLOVE) ×4 IMPLANT
GOWN STRL REUS W/ TWL LRG LVL3 (GOWN DISPOSABLE) ×16 IMPLANT
GOWN STRL REUS W/TWL LRG LVL3 (GOWN DISPOSABLE) ×16
IRRIGATION STRYKERFLOW (MISCELLANEOUS) IMPLANT
IRRIGATOR STRYKERFLOW (MISCELLANEOUS) ×2
IV NS 1000ML (IV SOLUTION) ×2
IV NS 1000ML BAXH (IV SOLUTION) ×1 IMPLANT
KIT IMAGING PINPOINTPAQ (MISCELLANEOUS) IMPLANT
KIT PINK PAD W/HEAD ARE REST (MISCELLANEOUS) ×2
KIT PINK PAD W/HEAD ARM REST (MISCELLANEOUS) ×1 IMPLANT
KIT TURNOVER CYSTO (KITS) ×2 IMPLANT
LABEL OR SOLS (LABEL) ×2 IMPLANT
MANIFOLD NEPTUNE II (INSTRUMENTS) ×2 IMPLANT
MANIPULATOR VCARE LG CRV RETR (MISCELLANEOUS) IMPLANT
MANIPULATOR VCARE SML CRV RETR (MISCELLANEOUS) IMPLANT
MANIPULATOR VCARE STD CRV RETR (MISCELLANEOUS) ×1 IMPLANT
NDL HYPO 22X1.5 SAFETY MO (MISCELLANEOUS) ×1 IMPLANT
NDL SPNL 22GX3.5 QUINCKE BK (NEEDLE) IMPLANT
NEEDLE HYPO 22X1.5 SAFETY MO (MISCELLANEOUS) ×2
NEEDLE SPNL 22GX3.5 QUINCKE BK (NEEDLE)
NEEDLE VERESS 14GA 120MM (NEEDLE) ×2 IMPLANT
NS IRRIG 1000ML POUR BTL (IV SOLUTION) ×2 IMPLANT
OBTURATOR OPTICAL STND 8 DVNC (TROCAR)
OBTURATOR OPTICALSTD 8 DVNC (TROCAR) IMPLANT
OCCLUDER COLPOPNEUMO (BALLOONS) ×3 IMPLANT
PACK GYN LAPAROSCOPIC (MISCELLANEOUS) ×2 IMPLANT
PACK LAP CHOLECYSTECTOMY (MISCELLANEOUS) ×2 IMPLANT
PAD OB MATERNITY 4.3X12.25 (PERSONAL CARE ITEMS) ×2 IMPLANT
PAD PREP OB/GYN DISP 24X41 (PERSONAL CARE ITEMS) ×2 IMPLANT
PENCIL SMOKE EVACUATOR (MISCELLANEOUS) ×2 IMPLANT
SCISSORS MNPLR CVD DVNC XI (INSTRUMENTS) ×2 IMPLANT
SCRUB CHG 4% DYNA-HEX 4OZ (MISCELLANEOUS) ×2 IMPLANT
SEAL UNIV 5-12 XI (MISCELLANEOUS) ×6 IMPLANT
SEALER VESSEL EXT DVNC XI (MISCELLANEOUS) ×1 IMPLANT
SET CYSTO W/LG BORE CLAMP LF (SET/KITS/TRAYS/PACK) IMPLANT
SET TUBE FILTERED XL AIRSEAL (SET/KITS/TRAYS/PACK) ×2 IMPLANT
SLEEVE Z-THREAD 5X100MM (TROCAR) IMPLANT
SOL ELECTROSURG ANTI STICK (MISCELLANEOUS) ×2
SOLUTION ELECTROSURG ANTI STCK (MISCELLANEOUS) ×1 IMPLANT
SPONGE T-LAP 18X18 ~~LOC~~+RFID (SPONGE) ×2 IMPLANT
STAPLER INSORB 30 2030 C-SECTI (MISCELLANEOUS) IMPLANT
STAPLER SKIN PROX 35W (STAPLE) IMPLANT
SUT DVC VLOC 180 0 12IN GS21 (SUTURE) ×2
SUT MNCRL 3-0 UNDYED SH (SUTURE) ×1 IMPLANT
SUT MNCRL 4-0 (SUTURE) ×4
SUT MNCRL 4-0 27XMFL (SUTURE) ×4
SUT PDS AB 1 TP1 96 (SUTURE) ×2 IMPLANT
SUT VIC AB 0 CT1 27 (SUTURE) ×4
SUT VIC AB 0 CT1 27XCR 8 STRN (SUTURE) ×4 IMPLANT
SUT VIC AB 0 CT1 36 (SUTURE) ×2 IMPLANT
SUT VIC AB 3-0 SH 27 (SUTURE) ×4
SUT VIC AB 3-0 SH 27X BRD (SUTURE) ×4 IMPLANT
SUT VICRYL 0 UR6 27IN ABS (SUTURE) ×2 IMPLANT
SUT VICRYL AB 3-0 FS1 BRD 27IN (SUTURE) ×2 IMPLANT
SUTURE DVC VLC 180 0 12IN GS21 (SUTURE) IMPLANT
SUTURE MNCRL 4-0 27XMF (SUTURE) ×1 IMPLANT
SYR 10ML LL (SYRINGE) ×2 IMPLANT
SYR 3ML LL SCALE MARK (SYRINGE) IMPLANT
SYR 50ML LL SCALE MARK (SYRINGE) ×3 IMPLANT
SYR BULB IRRIG 60ML STRL (SYRINGE) ×2 IMPLANT
SYR TOOMEY 50ML (SYRINGE) IMPLANT
TAPE TRANSPORE STRL 2 31045 (GAUZE/BANDAGES/DRESSINGS) ×2 IMPLANT
TRAP FLUID SMOKE EVACUATOR (MISCELLANEOUS) ×2 IMPLANT
TRAP SPECIMEN MUCUS 40CC (MISCELLANEOUS) IMPLANT
TRAY FOLEY MTR SLVR 16FR STAT (SET/KITS/TRAYS/PACK) ×2 IMPLANT
TROCAR XCEL NON-BLD 11X100MML (ENDOMECHANICALS) IMPLANT
TROCAR Z-THREAD FIOS 5X100MM (TROCAR) ×1 IMPLANT
WATER STERILE IRR 500ML POUR (IV SOLUTION) ×2 IMPLANT

## 2023-06-21 NOTE — Progress Notes (Signed)
Patient c/o nausea once being moved from stretcher to chair. Notified Dr. Joelene Millin. Droperidol ordered per Dr. Joelene Millin.

## 2023-06-21 NOTE — Discharge Instructions (Signed)

## 2023-06-21 NOTE — Anesthesia Postprocedure Evaluation (Signed)
Anesthesia Post Note  Patient: Kelly Woods  Procedure(s) Performed: XI ROBOTIC ASSISTED TOTAL HYSTERECTOMY WITH Bilateral SALPINGECTOMY, and left oophorectomy (Abdomen)  Patient location during evaluation: PACU Anesthesia Type: General Level of consciousness: awake and alert Pain management: pain level controlled Vital Signs Assessment: post-procedure vital signs reviewed and stable Respiratory status: spontaneous breathing, nonlabored ventilation, respiratory function stable and patient connected to nasal cannula oxygen Cardiovascular status: blood pressure returned to baseline and stable Postop Assessment: no apparent nausea or vomiting Anesthetic complications: no   No notable events documented.   Last Vitals:  Vitals:   06/21/23 1045 06/21/23 1100  BP: 124/84 108/71  Pulse: 68 68  Resp: 12 12  Temp:  (!) 36.4 C  SpO2: 95% 100%    Last Pain:  Vitals:   06/21/23 1100  PainSc: 0-No pain                 Louie Boston

## 2023-06-21 NOTE — Op Note (Addendum)
Operative Note   Date 06/21/2023 TIME 10:48 AM  PRE-OP DIAGNOSIS: Complex adnexal mass  POST-OP DIAGNOSIS: Complex adnexal mass  SURGEON: Surgeon(s) and Role: Panel 1: Thomasene Mohair, MD  ASSISTANT:   Angeles Leta Jungling, MD, MHSc  ANESTHESIA: General  PROCEDURE: Procedure(s): Exam under anesthesia, Robotic assisted total hysterectomy, unilateral oophorectomy and bilateral salpingectomy.  ESTIMATED BLOOD LOSS: minimal  DRAINS: Foley  TOTAL IV FLUIDS: see anesthesia report  SPECIMENS:  Uterus, left, ovary, and bilateral fallopian tubes, pelvic biopsy, and washings.  COMPLICATIONS: None  DISPOSITION: PACU  CONDITION: Stable  INDICATIONS: Complex left ovarian mass  FINDINGS: Exam under anesthesia revealed an normal 8 week mobile uterus. Left adnexal mass measuring ~ 15 cm and adherent to the pelvic floor on the left with one adhesion. Normal tubes and right ovary. Left pelvic adhesion. The parametria was smooth. The cervix was negative for gross lesions. Intraoperative findings included: The uterus was normal size and shape. The upper abdomen was normal including omentum, appendix, bowel, liver, stomach, and diaphragmatic surfaces. Left pelvic adhesion. There was no evidence of grossly enlarged pelvic or right para-aortic lymph nodes.   Intraoperative pathology: Benign appearing smooth walled left ovary mass.   PROCEDURE IN DETAIL: After informed consent was obtained, the patient was taken to the operating room where anesthesia was obtained without difficulty. The patient was positioned in the dorsal lithotomy position in [place] stirrups and her arms were carefully tucked at her sides and the usual precautions were taken.  She was prepped and draped in normal sterile fashion.  Time-out was performed.  A speculum was placed in the vagina and the cervical os was dilator. The uterus sounded to 6.5 cm.  A medium V-Care uterine manipulator was then placed in the uterus without  incident.    Operative entry was obtained via a optiview port in the LUQ in the midclavicular line two finger breadths below the rib cage. Visualization confirm appropriate entry. Pneumoperitoneum was created. The additonal robotic ports were placed in the supraumbilical incision and two bilateral sites. Washings were obtained. The patient was placed in Trendelenburg and the bowel was displaced up into the upper abdomen.    Round ligaments were divided on each side and the retroperitoneal space was opened bilaterally. The infundibulopelvic ligament on the left and uteroovarian ligament on the right were skeletonized, and the ureters were identified and preserved.  On the right the fallopian tube was detached from the ovary and peritoneal attachments. We attempted to move the left ovarian mass into the upper abdomen, however, the mass ruptured and clear fluid removed. The fluid was removed via suction and did not spill beyond the pelvic brim.   A bladder flap was created and the bladder was dissected down off the lower uterine segment and cervix. The uterus was manipulated to allow exposure of the uterine arteries. The uterine arteries were skeletonized bilaterally, sealed and divided with cautery and transected.  A colpotomy was performed circumferentially along the V-Care ring with electrocautery and the cervix was incised from the vagina. The V-care and uterine/right tube specimen was removed. The laparoscopic bag was placed through the vagina, left ovarian mass put in the bag, and removed via the vagina. Intraoperative pathology negative for malignancy.    A pneumo balloon was placed in the vagina. The vaginal cuff was then closed in a running continuous fashion using 0 V-Lock suture with careful attention to include the vaginal cuff angles and the vaginal mucosa within the closure. The left pelvic adhesion was removed.  Hemostasis was observed. The intraperitoneal pressure was dropped, and all planes  of dissection, vascular pedicles and the vaginal cuff were found to be hemostatic.  The trocars were removed and the skin incisions were closed with subcuticular stitch and Indermil glue.  The patient tolerated the procedure well.  Sponge, lap and needle counts were correct x2.  The patient was taken to recovery room in excellent condition.  Antibiotics: Given 1st or 2nd generation cephalosporin and Flagyl. Antibiotics given within 1 hour of the start of the procedure, Antibiotics ordered to be discontinued within 24 hours post procedure  VTE prophylaxis: was ordered perioperatively.   I assisted Dr. Jean Rosenthal with the procedure which could not have been performed without my assistance. I placed the Foley, vaginal manipulator, performed key elements of the left ovary resection and hysterectomy.   Artelia Laroche, MD  A personally was present during the entirety of this surgery and performed the significant portions of the surgery and agree with the operative report, as written by Dr. Sonia Side.  Thomasene Mohair, MD, [facility] 06/21/2023 11:22 AM

## 2023-06-21 NOTE — Progress Notes (Signed)
Patient urinated without difficulty

## 2023-06-21 NOTE — Progress Notes (Signed)
Patient resting comfortably in post op awaiting void prior to discharge. Continues to deny nausea/vomiting at this time. Family at bedside.

## 2023-06-21 NOTE — Anesthesia Procedure Notes (Signed)
Procedure Name: Intubation Date/Time: 06/21/2023 7:52 AM  Performed by: Jeannene Patella, CRNAPre-anesthesia Checklist: Patient identified, Emergency Drugs available, Suction available, Patient being monitored and Timeout performed Patient Re-evaluated:Patient Re-evaluated prior to induction Oxygen Delivery Method: Circle system utilized Preoxygenation: Pre-oxygenation with 100% oxygen Induction Type: IV induction Ventilation: Mask ventilation without difficulty Laryngoscope Size: McGraph and 3 Grade View: Grade I Tube type: Oral Tube size: 6.5 mm Number of attempts: 1 Airway Equipment and Method: Stylet and Video-laryngoscopy Placement Confirmation: ETT inserted through vocal cords under direct vision, positive ETCO2 and breath sounds checked- equal and bilateral Secured at: 20 (at teeth) cm Tube secured with: Tape Dental Injury: Teeth and Oropharynx as per pre-operative assessment

## 2023-06-21 NOTE — Anesthesia Preprocedure Evaluation (Signed)
Anesthesia Evaluation  Patient identified by MRN, date of birth, ID band Patient awake    Reviewed: Allergy & Precautions, NPO status , Patient's Chart, lab work & pertinent test results  History of Anesthesia Complications Negative for: history of anesthetic complications  Airway Mallampati: II  TM Distance: >3 FB Neck ROM: full    Dental no notable dental hx.    Pulmonary neg pulmonary ROS   Pulmonary exam normal        Cardiovascular Normal cardiovascular exam+ dysrhythmias (PVCs, bradycardia)      Neuro/Psych negative neurological ROS  negative psych ROS   GI/Hepatic negative GI ROS, Neg liver ROS,,,  Endo/Other  negative endocrine ROS    Renal/GU negative Renal ROS  negative genitourinary   Musculoskeletal   Abdominal   Peds  Hematology negative hematology ROS (+)   Anesthesia Other Findings Past Medical History: No date: Bradycardia No date: History of chicken pox No date: History of iron deficiency anemia No date: History of seizure     Comment:  as a child No date: IUD (intrauterine device) in place No date: Ovarian mass, left No date: PVC's (premature ventricular contractions)     Comment:  pt feels pvc's every night once she lies down No date: Raynaud phenomenon  Past Surgical History: 2007: COLPOSCOPY  BMI    Body Mass Index: 21.03 kg/m      Reproductive/Obstetrics negative OB ROS                             Anesthesia Physical Anesthesia Plan  ASA: 2  Anesthesia Plan: General ETT   Post-op Pain Management: Toradol IV (intra-op)*, Ofirmev IV (intra-op)* and Ketamine IV*   Induction: Intravenous  PONV Risk Score and Plan: 4 or greater and Ondansetron, Dexamethasone, Midazolam and Treatment may vary due to age or medical condition  Airway Management Planned: Oral ETT  Additional Equipment:   Intra-op Plan:   Post-operative Plan: Extubation in  OR  Informed Consent: I have reviewed the patients History and Physical, chart, labs and discussed the procedure including the risks, benefits and alternatives for the proposed anesthesia with the patient or authorized representative who has indicated his/her understanding and acceptance.     Dental Advisory Given  Plan Discussed with: Anesthesiologist, CRNA and Surgeon  Anesthesia Plan Comments: (Patient consented for risks of anesthesia including but not limited to:  - adverse reactions to medications - damage to eyes, teeth, lips or other oral mucosa - nerve damage due to positioning  - sore throat or hoarseness - Damage to heart, brain, nerves, lungs, other parts of body or loss of life  Patient voiced understanding.)        Anesthesia Quick Evaluation

## 2023-06-21 NOTE — Interval H&P Note (Signed)
History and Physical Interval Note:  06/21/2023 7:31 AM  Kelly Woods  has presented today for surgery, with the diagnosis of pelvic mass.  The various methods of treatment have been discussed with the patient and family. After consideration of risks, benefits and other options for treatment, the patient has consented to  Procedure(s): XI ROBOTIC ASSISTED TOTAL HYSTERECTOMY WITH UNILATERAL SALPINGO OOPHORECTOMY, SALPINGECTOMY, POSSIBLE REMOVAL OF OTHER OVARY, SURGICAL STAGING WITH PELVIC/AORTIC LYMPH NODE BIOPSIES, OMENTECTOMY, BOWEL RESECTION, COLOSTOMY, LAPAROTOMY (N/A) EXPLORATORY LAPAROTOMY (N/A) as a surgical intervention.  The patient's history has been reviewed, patient examined, no change in status, stable for surgery.  I have reviewed the patient's chart and labs.  Questions were answered to the patient's satisfaction.     Kelly Woods [employer]

## 2023-06-21 NOTE — Transfer of Care (Signed)
Immediate Anesthesia Transfer of Care Note  Patient: Kelly Woods  Procedure(s) Performed: XI ROBOTIC ASSISTED TOTAL HYSTERECTOMY WITH Bilateral SALPINGECTOMY, and left oophorectomy (Abdomen)  Patient Location: PACU  Anesthesia Type:General  Level of Consciousness: drowsy, patient cooperative, and responds to stimulation  Airway & Oxygen Therapy: Patient Spontanous Breathing and Patient connected to face mask oxygen  Post-op Assessment: Report given to RN and Post -op Vital signs reviewed and stable  Post vital signs: Reviewed and stable  Last Vitals:  Vitals Value Taken Time  BP 119/78 06/21/23 1033  Temp 36.4 C 06/21/23 1033  Pulse 78 06/21/23 1036  Resp 12 06/21/23 1036  SpO2 100 % 06/21/23 1036  Vitals shown include unfiled device data.  Last Pain:  Vitals:   06/21/23 1033  PainSc: 0-No pain         Complications: No notable events documented.

## 2023-06-21 NOTE — Progress Notes (Signed)
Patient attempted to void unsuccessfully. Bladder scan performed. 112 ml. PO fluids encouraged.

## 2023-06-22 ENCOUNTER — Encounter: Payer: Self-pay | Admitting: Obstetrics and Gynecology

## 2023-06-24 LAB — TYPE AND SCREEN
ABO/RH(D): O POS
Antibody Screen: POSITIVE
Unit division: 0
Unit division: 0

## 2023-06-24 LAB — BPAM RBC
Blood Product Expiration Date: 202409162359
Blood Product Expiration Date: 202409162359
ISSUE DATE / TIME: 202408141549
Unit Type and Rh: 5100
Unit Type and Rh: 5100

## 2023-06-26 ENCOUNTER — Telehealth: Payer: Self-pay

## 2023-06-26 NOTE — Telephone Encounter (Signed)
Dr. Sonia Side is aware of pathology and will be contacting Ms. Maus.

## 2023-07-06 ENCOUNTER — Telehealth: Payer: Self-pay

## 2023-07-06 NOTE — Telephone Encounter (Signed)
Received call from Ms. Vanwingerden. She has multiple questions for Dr. Sonia Side and is requesting a call. Message sent to Dr. Sonia Side.

## 2023-07-10 ENCOUNTER — Telehealth: Payer: Self-pay | Admitting: *Deleted

## 2023-07-10 NOTE — Telephone Encounter (Signed)
Patient called reporting that since her abdominal surgery a couple of weeks ago, she is now having  a couple of bouts of severe abdominal when food is trying to pass through her intestines. She is asking if anything needs to be done or if she needs to be worried about it. Please advise

## 2023-07-13 NOTE — Telephone Encounter (Signed)
Spoke with Marchelle Folks. Dr. Sonia Side called her on 9/10 for pathology update. Pathology still pending.

## 2023-07-17 ENCOUNTER — Encounter: Payer: Self-pay | Admitting: Obstetrics and Gynecology

## 2023-07-19 ENCOUNTER — Telehealth: Payer: Self-pay | Admitting: Nurse Practitioner

## 2023-07-19 DIAGNOSIS — C569 Malignant neoplasm of unspecified ovary: Secondary | ICD-10-CM

## 2023-07-19 NOTE — Telephone Encounter (Signed)
Spoke to patient regarding her pathology result and outside path review at [facility]. Findings were reviewed by Dr. Johnnette Litter who recommends case discussion at [facility] Case Conference on Monday. She has her post op scheduled next week and we will discuss recommendations at that time.

## 2023-07-26 ENCOUNTER — Encounter: Payer: Self-pay | Admitting: Obstetrics and Gynecology

## 2023-07-26 ENCOUNTER — Inpatient Hospital Stay: Payer: Managed Care, Other (non HMO) | Attending: Obstetrics and Gynecology | Admitting: Obstetrics and Gynecology

## 2023-07-26 VITALS — BP 107/70 | HR 56 | Temp 98.6°F | Resp 20 | Wt 126.6 lb

## 2023-07-26 DIAGNOSIS — R1900 Intra-abdominal and pelvic swelling, mass and lump, unspecified site: Secondary | ICD-10-CM | POA: Insufficient documentation

## 2023-07-26 DIAGNOSIS — C569 Malignant neoplasm of unspecified ovary: Secondary | ICD-10-CM

## 2023-07-26 NOTE — Progress Notes (Signed)
Gynecologic Oncology Interval Note  Referring Provider: Dr Birdie Sons  Chief Concern: Postoperative evaluation and discussion Subjective:  Kelly Woods is a 47 y.o. female who is seen in consultation from Dr. Birdie Sons for large pelvic mass.   06/21/2023 underwent an Exam under anesthesia, Robotic assisted total hysterectomy, unilateral oophorectomy  and bilateral salpingectomy.  Her operative pathology was benign.  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  At the time of her surgery her appendix was noted to be normal.  Washings were obtained prior to rupture.  Pathology  Diagnosis 1. 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 - SEE CANCER SUMMARY BELOW. - FALLOPIAN TUBE WITH FIMBRIATED END; NEGATIVE FOR MALIGNANCY. 2. Uterus and cervix, Right tube - CERVIX WITH NABOTHIAN CYSTS. - ENDOMETRIUM WITH DECIDUALIZED STROMA CONSISTENT WITH PROGESTIN EFFECT. - UNREMARKABLE MYOMETRIUM. - RIGHT FALLOPIAN TUBE WITH FIMBRIATED END AND PARATUBAL CYST. - NEGATIVE FOR MALIGNANCY. 3. Adhesions, Left pelvic - FIBROUS TISSUE WITH VASCULAR CONGESTION AND REACTIVE MESOTHELIUM. - NEGATIVE FOR MALIGNANCY. Microscopic Comment 1. CASE SUMMARY: (OVARY or FALLOPIAN TUBE or PRIMARY PERITONEUM) Standard(s): AJCC-UICC 8, FIGO Cancer Report 2021 SPECIMEN Procedure: Total hysterectomy, bilateral salpingectomy, left oophorectomy Specimen Integrity: Left ovary integrity: Capsule ruptured TUMOR Tumor Site: Left ovary Tumor Size: Greatest dimension 15.2 cm Histologic Type: Mucinous borderline tumor with microinvasion Histologic Grade: Not applicable Ovarian Surface Involvement: Not applicable Fallopian Tube Surface Involvement: Not applicable Implants: Not applicable Other Tissue/ Organ Involvement: Not identified Largest Extrapelvic Peritoneal Focus: Not  applicable  Microscopic Comment(continued) Peritoneal/Ascitic Fluid Involvement: Malignant cells present Chemotherapy Response Score (CRS): Not applicable REGIONAL LYMPH NODES Regional Lymph Nodes Status: Not applicable (no regional lymph nodes submitted or found) DISTANT METASTASIS Distant Site(s) Involved, if applicable: Not applicable pTNM CLASSIFICATION (AJCC 8th Edition): Modified Classification: Not applicable pT Category: pT1c3 T suffix: Not applicable pN Category: pN - Not assigned (no nodes submitted or found) N Suffix: Not applicable pM - Not applicable FIGO STAGE (2021 FIGO Cancer Report) FIGO Stage: IC3 (v1.5.0.0) Diagnosis Note 1. See concurrent case [id number]. These findings were communicated to Dr. Sonia Side and Ladora Daniel, RN on 06/23/2023. This case underwent intradepartmental consultation and Dr. Reynolds Bowl concurs with the interpretation.  Diagnosis Pelvic Wash - POSITIVE FOR MALIGNANCY. - METASTATIC CARCINOMA IS PRESENT.  Her pathology was reviewed at [facility] and we discussed her case at tumor board.  Tumor board options: discuss oophorectomy on the right ovary versus observation with CEA and pelvic ultrasounds.    If we did proceed with surgery I would also repeat washings and perform peritoneal biopsies.      Results       Procedure Component Value Units Date/Time    External Pathology Result [[id number]] Resulted: 06/21/23 0000      Updated: 07/06/23 1202    Narrative:      Ordered by an unspecified provider.    Pathology - Slide Consult [[id number]] Collected: 06/21/23    Specimen: Tissue-Pathology from Uterus, Tissue-Pathology from Pelvic Washing Updated: 07/14/23 1305      Case Report --      Surgical Pathology Report                         Case: [id number]                                 Authorizing Provider:  Evelena Asa [place],   Collected:           06/21/2023                                         MD                                                                           Ordering Location:     [facility] and Received:            06/30/2023 4098                                    Cytopathology                                                               Pathologist:           Jonnie Finner, MD                                                      Specimens:   A) - Uterus, [id number]                                                                            B) - Pelvic Washing, [id number]                                                               DIAGNOSIS --      A.  Outside consult, [phone], [facility], [place], [place].  Date of procedure 06/21/23, designated as:     "1.Ovary and fallopian tube, left":   Ovary: Mucinous borderline tumor with microinvasion. Fallopian tube: No significant histopathologic abnormality. See comment.   B.  Outside consult, [id number], Same as above.  Date of procedure 06/21/23, designated as:      "Pelvic wash": Atypical/Inconclusive. Rare atypical cells present. See comment.          Diagnostic Comment --      On part A ([id number]) only slides from the left ssalpingo-oophorectomy were received for review at [facility]. Per the outside pathology report the left ovarian cystic mass (15.2 cm) had a ruptured capsule.  On part B, a thin prep slide from the pelvic wash ([id number]) was received and shows few atypical epithelioid cell groups in a background of reactive mesothelial cells, and mixed inflammation. The cell block was requested from the outside institution in an attempt to further characterize these cells. However, the sections from the cell block are essentially acellular precluding further characterization of the atypical cells. Clinical correlation is recommended.     This case was seen in consultation with Dr.Bean, who agrees with the final interpretation.           Clinical Information --      Adnexal mass        Gross Examination --      Outside consult  A:              "[id number]" Date of surgery:              06/21/23 Number of stained slides:             15 Number of blocks:                        0 Number of unstained slides:         0   Outside consult B:              "[id number]" Date of surgery:              06/21/23 Number of stained slides:             1 Number of blocks:                        1; Block 1/A (yellow); [facility] label B-2 Number of unstained slides:         0   Outside path report received?    Yes Material to be returned?             Yes   Received from:    [facility] [address] [place], [place] [address] P:  [phone] F:  [phone]        Microscopic Examination --      Microscopic examination is performed.        Additional Documentation --      All immunohistochemistry, in situ hybridization tests and special stains performed at [facility] and reported herein were developed, validated and their performance characteristics determined by the [facility]. During the performance of these tests, appropriate positive and negative control slides are also performed and reviewed. All control slides and internal controls (when applicable) demonstrate the expected immunoreactive patterns and/or nucleic acid hybridization. These ancillary studies were deemed medically necessary by the requesting pathologist. They were ordered following review of the H&E and clinical history except when part of a liver/kidney protocol or where clinical history (e.g. immunocompromised, critically ill, history of malignancy) clearly indicates. Some of the tests may not be cleared or approved by the U.S. Food and Drug Administration (FDA).  The FDA has determined that such clearance or approval is not necessary.  These tests are used for clinical purposes and should not be regarded as investigational or as research.  This laboratory is certified under the Clinical Laboratory Improvement  Amendments of 1988 (CLIA) as qualified to perform high complexity clinical testing.        Attestation --      All of the diagnostic evaluations on the enumerated specimens  have been personally conducted by the pathologists involved in the care of this patient as indicated by the electronic signatures above.    She is recovering from surgery. She did have subcutaneous emphysema after surgery that was very uncomfortable. She had significant nausea after anesthesia that was very uncomfortable. Her symptoms resolved with anti-emetics. She also complains of crampy abdominal pain associated with bowel movements and wonders if there is something involving her bowels. She is interested in proceeding with surgery.   Gynecologic Oncology History   Saw Dr Birdie Sons 05/11/23. "Patient reports abdominal pain starting 05/13/2023. Felt a twisty feeling in her lower abdomen at bedtime and was quite uncomfortable overnight. Laying down made it worse. She is more comfortable sitting up. Bothers her when she is up moving around.She did have some diarrhea though she notes this following taking a laxative so she is not sure if the laxative cause diarrhea or for that is related to her abdominal pain. No nausea or vomiting. No vaginal bleeding or vaginal discharge. No blood in her stool. No blood in her urine. No dysuria. No urinary frequency. No urinary urgency. No fevers. She has not IUD in place. No abdominal surgeries in the past per her report. She typically has regular bowel movements."  UA microscopic blood, culture negative.  Referral to urology placed.   ASCUS PAP, HPV negative earlier this year.  Mirena IUD in place, no menses.   NSVD x 2  CT scan 05/16/23 FINDINGS: Lower chest: No acute abnormality.   Hepatobiliary: No solid liver abnormality is seen. Hepatic steatosis. No gallstones, gallbladder wall thickening, or biliary dilatation.   Pancreas: Unremarkable. No pancreatic ductal dilatation  or surrounding inflammatory changes.   Spleen: Normal in size without significant abnormality.   Adrenals/Urinary Tract: Adrenal glands are unremarkable. Kidneys are normal, without renal calculi, solid lesion, or hydronephrosis. Bladder is unremarkable.   Stomach/Bowel: Stomach is within normal limits. Appendix appears normal (series 4, image 60). No evidence of bowel wall thickening, distention, or inflammatory changes. Moderate burden of stool throughout the colon.   Vascular/Lymphatic: No significant vascular findings are present. No enlarged abdominal or pelvic lymph nodes.   Reproductive: IUD present in the fundal endometrial cavity. Large multicystic mass in the central and posterior pelvis, which appears to arise from the left ovary or adnexa, measuring at least 11.6 x 8.5 x 8.5 cm (series 2, image 68, series 4, image 80). Corpus luteum in the right ovary (series 2, image 16).   Other: No abdominal wall hernia or abnormality. Small volume free fluid in the pelvis (series 2, image 68).   Musculoskeletal: No acute or significant osseous findings.   IMPRESSION: 1. Large multicystic mass in the central and posterior pelvis, which appears to arise from the left ovary or adnexa, measuring at least 11.6 x 8.5 x 8.5 cm. 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 Pelvic ultrasound and contrast enhanced MRI may be helpful for further assessment. 2. Small volume free fluid in the pelvis, nonspecific. 3. Normal appendix. 4. Hepatic steatosis.  Pelvic US 05/17/23 FINDINGS: Uterus   Measurements: 8.3 x 3.3 x 4.6 cm = volume: 65.6 mL. No fibroids or other mass visualized.   Endometrium   Thickness: 4 mm.  IUD noted within the uterus.   Right ovary   Measurements: 3.7 x 1.9 x 2.1 cm = volume: 7.72 mL. Small hypoechoic structure with diffuse internal echoes within the right ovary measures  1.6 x 1.0  x 1.5 cm.   Left ovary   Measurements: 13.4 x 6.8 x 9.7 cm = volume: 460.6 mL. There is a large complex multiloculated cystic mass within the left ovary which measures 7.9 x 6.9 x 9.1 cm. This contains multiple areas of thickened and internal septation which measures up to 4 mm in thickness. There is increased blood flow localizing to the areas of internal septation. Low level internal echoes identified within the cystic components of this mass.   Other findings   Trace free fluid noted within the pelvis.   IMPRESSION: 1. There is a large complex multiloculated cystic mass within the left ovary which measures 7.9 x 6.9 x 9.1 cm. This contains multiple areas of thickened and internal septation which measures up to 4 mm in thickness. There is increased blood flow localizing to the areas of internal septation. Low level internal echoes identified within the cystic components of this mass. 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 Further evaluation with MRI of the pelvis with and without contrast is advised. Additionally referral to gynecologic oncology is recommended for further management. 2. Small hypoechoic structure with diffuse internal echoes within the right ovary measures 1.6 x 1.0 x 1.5 cm. This is favored to represent a small hemorrhagic cyst. 3. IUD noted within the uterus. 4. Trace free fluid noted within the pelvis.   Surgical management recommended  Problem List: Patient Active Problem List   Diagnosis Date Noted   Right lower quadrant abdominal pain 05/16/2023   Ovarian mass 05/16/2023   Well woman exam with routine gynecological exam 03/23/2023   History of iron deficiency anemia 03/23/2023   Raynaud phenomenon 04/19/2021   Irregular heartbeat 04/19/2021   Bradycardia 04/19/2021   IUD (intrauterine device) in place 04/01/2019    Past Medical History: Past Medical History:  Diagnosis Date   History of chicken pox    History of  seizure    as a child    Past Surgical History: Past Surgical History:  Procedure Laterality Date   COLPOSCOPY  2007      OB History:  OB History  Gravida Para Term Preterm AB Living  2 2 2     2   SAB IAB Ectopic Multiple Live Births          2    # Outcome Date GA Lbr Len/2nd Weight Sex Type Anes PTL Lv  2 Term 01/10/11          1 Term 02/18/09            Family History: Family History  Problem Relation Age of Onset   Psoriasis Mother    Sleep apnea Mother    Hyperlipidemia Father    Heart disease Maternal Grandfather        by-pass surgery   Diabetes Paternal Aunt    Breast cancer Paternal Aunt        53's?    Social History: Social History   Socioeconomic History   Marital status: Married    Spouse name: Fayrene Fearing Rosanne Ashing)   Number of children: 2   Years of education: Bachelors degree   Highest education level: Not on file  Occupational History   Not on file  Tobacco Use   Smoking status: Never   Smokeless tobacco: Never  Vaping Use   Vaping status: Never Used  Substance and Sexual Activity   Alcohol use: Yes    Comment: 2 drinks a week or so   Drug use: Never   Sexual activity:  Yes    Birth control/protection: I.U.D.    Comment: IUD - about 5 years ago  Other Topics Concern   Not on file  Social History Narrative   04/01/19   From: [place] originally    Living: with husband Fayrene Fearing and 2 kids   Work: [employer]      Family: 2 kids - Government social research officer and Ava      Enjoys: running - distance up to 1/2 marathon, hiking, geocashing       Exercise: running   Diet: cooks at home, reasonably well - overall doing well      Safety   Seat belts: Yes    Guns: No   Safe in relationships: Yes    Social Determinants of Corporate investment banker Strain: Low Risk  (04/01/2019)   Overall Financial Resource Strain (CARDIA)    Difficulty of Paying Living Expenses: Not hard at all  Food Insecurity: Not on file  Transportation Needs: Not on file  Physical Activity: Not  on file  Stress: Not on file  Social Connections: Not on file  Intimate Partner Violence: Not on file    Allergies: No Known Allergies  Current Medications: Current Outpatient Medications  Medication Sig Dispense Refill   Levonorgestrel (MIRENA, 52 MG, IU) by Intrauterine route.     Multiple Vitamins-Minerals (MULTIVITAMIN ADULT PO) Take by mouth.     No current facility-administered medications for this visit.    Review of Systems General: no complaints  HEENT: no complaints  Lungs: no complaints  Cardiac: no complaints  GI: abdominal pain and bloating; diarrhea and constipation - her symptoms are mild. Discomfort is improving  GU: no complaints  Musculoskeletal: no complaints  Extremities: no complaints  Skin: no complaints  Neuro: numbness and tingling  Endocrine: no complaints  Psych: no complaints        Objective:  Physical Examination:  BP 103/71   Pulse (!) 55   Temp (!) 96.6 F (35.9 C)   Resp 20   Wt 131 lb 12.8 oz (59.8 kg)   SpO2 100%   BMI 20.95 kg/m    GENERAL: Patient is a well appearing female in no acute distress HEENT:  PERRL, neck supple with midline trachea.  NODES:  No supraclavicular or axillary lymphadenopathy palpated. There is bilateral shotty inguinal adenopathy. LUNGS:   normal respiratory effort  ABDOMEN:  Soft, nontender.  Nondistended no masses no hernias no ascites EXTREMITIES:  No peripheral edema.   SKIN: Incisions are all clear dry and intact NEURO:  Nonfocal. Well oriented.  Appropriate affect.  Pelvic: EGBUS: no lesions Cervix: Surgically absent Vagina: no lesions, no discharge or bleeding. Cuff healing well.  On palpation the cuff is still slightly indurated nontender Uterus: Surgically absent Adnexa: no palpable masses    Lab Review Lab Results  Component Value Date   WBC 5.3 05/16/2023   HGB 12.5 05/16/2023   HCT 38.8 05/16/2023   MCV 87 05/16/2023   PLT 185 05/16/2023      Chemistry      Component  Value Date/Time   NA 140 05/16/2023 0956   K 5.0 05/16/2023 0956   CL 103 05/16/2023 0956   CO2 25 05/16/2023 0956   BUN 11 05/16/2023 0956   CREATININE 0.94 05/16/2023 0956      Component Value Date/Time   CALCIUM 9.8 05/16/2023 0956   ALKPHOS 99 05/16/2023 0956   AST 24 05/16/2023 0956   ALT 16 05/16/2023 0956   BILITOT 0.7 05/16/2023 0956  Assessment:  Kelly Woods is a 47 y.o. 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   Postoperative nausea  Medical co-morbidities complicating care: Raynaud's bradycardia (she is a runner), irregular heart beat occasionally (PVCs).  Plan:   Problem List Items Addressed This Visit       Other   Right lower quadrant abdominal pain   Ovarian mass - Primary    We discussed options for management. I reviewed Tumor board options: discuss oophorectomy on the right ovary versus observation with CEA and pelvic ultrasounds.    If we did proceed with surgery I would also repeat washings and perform peritoneal biopsies. We can also discuss the role of appendectomy - I will discuss with the team.   She would like to proceed with surgery and plan to scheduled at [facility] for robotic assisted right salpingo-oophorectomy, peritoneal biopsies and repeat washings.  Plan for November 7th, 2024. Will need to determine the need for appendectomy and we will discuss this at her preoperative visit.    Regard to postoperative nausea I recommended that she talk to the anesthesia team about this so they can mitigate her symptoms.   The patient's diagnosis, an outline of the further diagnostic and laboratory studies which will be required, the recommendation, and alternatives were discussed.  All questions were answered to the patient's satisfaction.  A total of 30 minutes were spent with the patient/family today; 50% was spent in education, counseling and coordination of care for pelvic mass.     Kelly Woods Leta Jungling, MD     CC:  Glori Luis, MD [address] [place],  [place] [address] [phone]

## 2023-08-08 ENCOUNTER — Encounter: Payer: Self-pay | Admitting: Obstetrics and Gynecology

## 2023-08-08 ENCOUNTER — Inpatient Hospital Stay: Payer: Managed Care, Other (non HMO) | Attending: Obstetrics and Gynecology | Admitting: Nurse Practitioner

## 2023-08-08 ENCOUNTER — Encounter: Payer: Self-pay | Admitting: Nurse Practitioner

## 2023-08-08 VITALS — BP 113/79 | HR 53 | Temp 98.4°F | Wt 128.8 lb

## 2023-08-08 DIAGNOSIS — I493 Ventricular premature depolarization: Secondary | ICD-10-CM | POA: Diagnosis not present

## 2023-08-08 DIAGNOSIS — N939 Abnormal uterine and vaginal bleeding, unspecified: Secondary | ICD-10-CM | POA: Diagnosis present

## 2023-08-08 DIAGNOSIS — D509 Iron deficiency anemia, unspecified: Secondary | ICD-10-CM | POA: Insufficient documentation

## 2023-08-08 DIAGNOSIS — Z803 Family history of malignant neoplasm of breast: Secondary | ICD-10-CM | POA: Diagnosis not present

## 2023-08-08 DIAGNOSIS — C569 Malignant neoplasm of unspecified ovary: Secondary | ICD-10-CM | POA: Diagnosis not present

## 2023-08-08 DIAGNOSIS — Z79899 Other long term (current) drug therapy: Secondary | ICD-10-CM | POA: Insufficient documentation

## 2023-08-08 DIAGNOSIS — I7300 Raynaud's syndrome without gangrene: Secondary | ICD-10-CM | POA: Diagnosis not present

## 2023-08-08 NOTE — Progress Notes (Signed)
[facility]  [facility] [place]. [facility] [address] [place], [place] [address] [phone] (phone) [phone] (fax)  Patient Care Team: Bethanie Dicker, NP as PCP - General (Nurse Practitioner) Benita Gutter, RN as Oncology Nurse Navigator   Name of the patient: Kelly Woods  [id number]  21-Aug-1976   Date of visit: 08/08/23  Diagnosis- mucinous cystadenoma of borderline malignancy  Chief complaint/ Reason for visit- vaginal bleeding  Heme/Onc history:  Oncology History   No history exists.    Interval history- Patient is 47 year old female with above diagnosis of borderline mucinous tumor with microinvasion and washings concerning for metastatic cancer. She underwent RA-TH, unilateral oophorectomy, and bilateral salpingectomy on 06/21/23 with Dr Sonia Side. She had post op on 07/26/23. She has continued pelvic rest but starting running a bit more. Since her post op, she noticed vaginal bleeding. Saturating a heavy panty liner/light pad to 3 per day. She also feels That her pain has increased. Moving her bowels normally. No fevers or chills. She plans completion oophorectomy at [facility] next month.   Review of systems- Review of Systems  Constitutional:  Negative for chills, fever, malaise/fatigue and weight loss.  HENT:  Negative for congestion, ear discharge, ear pain, sinus pain, sore throat and tinnitus.   Eyes: Negative.   Respiratory: Negative.  Negative for cough, sputum production and shortness of breath.   Cardiovascular:  Negative for chest pain, palpitations, orthopnea, claudication and leg swelling.  Gastrointestinal:  Negative for abdominal pain, blood in stool, constipation, diarrhea, heartburn, nausea and vomiting.  Genitourinary:  Negative for hematuria.  Skin: Negative.   Neurological:  Negative for dizziness, tingling, weakness and headaches.   Endo/Heme/Allergies: Negative.   Psychiatric/Behavioral:  Negative for depression. The patient is not nervous/anxious.      No Known Allergies  Past Medical History:  Diagnosis Date   Bradycardia    History of chicken pox    History of iron deficiency anemia    History of seizure    as a child   IUD (intrauterine device) in place    Ovarian mass, left    PVC's (premature ventricular contractions)    pt feels pvc's every night once she lies down   Raynaud phenomenon     Past Surgical History:  Procedure Laterality Date   COLPOSCOPY  2007   ROBOTIC ASSISTED TOTAL HYSTERECTOMY WITH BILATERAL SALPINGO OOPHERECTOMY N/A 06/21/2023   Procedure: XI ROBOTIC ASSISTED TOTAL HYSTERECTOMY WITH Bilateral SALPINGECTOMY, and left oophorectomy;  Surgeon: Artelia Laroche, MD;  Location: [facility] ORS;  Service: Gynecology;  Laterality: N/A;    Social History   Socioeconomic History   Marital status: Married    Spouse name: Fayrene Fearing Rosanne Ashing)   Number of children: 2   Years of education: Bachelors degree   Highest education level: Not on file  Occupational History   Not on file  Tobacco Use   Smoking status: Never   Smokeless tobacco: Never  Vaping Use   Vaping status: Never Used  Substance and Sexual Activity   Alcohol use: Not Currently    Comment: 5 drinks weekly-has not been drinking since abdominal pain has started back in July   Drug use: Never   Sexual activity: Yes    Birth control/protection: I.U.D.    Comment: IUD - about 5 years ago  Other Topics Concern   Not on file  Social History Narrative   04/01/19  From: [place] originally    Living: with husband Fayrene Fearing and 2 kids   Work: [employer]      Family: 2 kids - Government social research officer and Ava      Enjoys: running - distance up to 1/2 marathon, hiking, geocashing       Exercise: running   Diet: cooks at home, reasonably well - overall doing well      Safety   Seat belts: Yes    Guns: No   Safe in relationships: Yes    Social  Determinants of Corporate investment banker Strain: Low Risk  (04/01/2019)   Overall Financial Resource Strain (CARDIA)    Difficulty of Paying Living Expenses: Not hard at all  Food Insecurity: Not on file  Transportation Needs: Not on file  Physical Activity: Not on file  Stress: Not on file  Social Connections: Not on file  Intimate Partner Violence: Not on file    Family History  Problem Relation Age of Onset   Psoriasis Mother    Sleep apnea Mother    Hyperlipidemia Father    Heart disease Maternal Grandfather        by-pass surgery   Diabetes Paternal Aunt    Breast cancer Paternal Aunt        35's?     Current Outpatient Medications:    Multiple Vitamins-Minerals (MULTIVITAMIN ADULT PO), Take 1 tablet by mouth daily at 6 (six) AM., Disp: , Rfl:    ibuprofen (ADVIL) 600 MG tablet, Take 1 tablet (600 mg total) by mouth every 6 (six) hours. (Patient not taking: Reported on 07/26/2023), Disp: 30 tablet, Rfl: 0   ondansetron (ZOFRAN-ODT) 4 MG disintegrating tablet, Take 1 tablet (4 mg total) by mouth every 6 (six) hours as needed for nausea. (Patient not taking: Reported on 07/26/2023), Disp: 20 tablet, Rfl: 0   oxyCODONE-acetaminophen (PERCOCET) 5-325 MG tablet, Take 1 tablet by mouth every 6 (six) hours as needed (breakthrough pain). (Patient not taking: Reported on 07/26/2023), Disp: 20 tablet, Rfl: 0  Physical exam:  Vitals:   08/08/23 0905  BP: 113/79  Pulse: (!) 53  Temp: 98.4 F (36.9 C)  TempSrc: Tympanic  SpO2: 100%  Weight: 128 lb 12.8 oz (58.4 kg)   Physical Exam Constitutional:      Appearance: She is not ill-appearing.  Abdominal:     General: There is no distension.     Tenderness: There is no abdominal tenderness.     Comments: Abdominal incisions are healing well.   Skin:    General: Skin is dry.     Coloration: Skin is not pale.  Neurological:     Mental Status: She is alert and oriented to person, place, and time.  Psychiatric:        Mood and  Affect: Mood normal.        Behavior: Behavior normal.   Pelvic: Exam chaperoned by CMA, Zaria EGBUS: no lesions. Evidence of blood at vaginal introitus Vagina: small amount of blood in vaginal vault including clot. Once cleared, no evidence of active bleeding. Sutures of vaginal cuff are intact, slightly indurated but remain nontender. No evidence of separation.  BME: deferred.    Assessment and plan- Patient is a 47 y.o. 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 Sonia Side. Post op pelvic exam showed evidence of healing on 07/26/23. No active bleeding on exam. No separation of vaginal  cuff. I suspect that her suture has pulled with increased exertion and caused the bleeding or she has a draining hematoma. Recommend continued monitoring. If bleeding worsens, I'd want to see her back in clinic. Continue pelvic rest. Reduce activity.  Mucinous cystoadenoma- she plans for completion surgery with Dr Sonia Side at [facility] next month.   RTC as scheduled or sooner as needed -la   Visit Diagnosis 1. Vaginal bleeding, abnormal   2. Mucinous cystadenoma, borderline malignancy (HCC)     Patient expressed understanding and was in agreement with this plan. She also understands that She can call clinic at any time with any questions, concerns, or complaints.   Thank you for allowing me to participate in the care of this very pleasant patient.   Consuello Masse, DNP, AGNP-C, AOCNP [facility] [phone]

## 2023-08-23 ENCOUNTER — Encounter: Payer: Self-pay | Admitting: Cardiology

## 2023-08-23 ENCOUNTER — Ambulatory Visit: Payer: Managed Care, Other (non HMO) | Attending: Cardiology | Admitting: Cardiology

## 2023-08-23 VITALS — BP 116/76 | HR 48 | Ht 66.0 in | Wt 128.0 lb

## 2023-08-23 DIAGNOSIS — I493 Ventricular premature depolarization: Secondary | ICD-10-CM

## 2023-08-23 DIAGNOSIS — R001 Bradycardia, unspecified: Secondary | ICD-10-CM | POA: Diagnosis not present

## 2023-08-23 NOTE — Patient Instructions (Signed)
Medication Instructions:   Your physician recommends that you continue on your current medications as directed. Please refer to the Current Medication list given to you today.  *If you need a refill on your cardiac medications before your next appointment, please call your pharmacy*   Lab Work:  None Ordered  If you have labs (blood work) drawn today and your tests are completely normal, you will receive your results only by: MyChart Message (if you have MyChart) OR A paper copy in the mail If you have any lab test that is abnormal or we need to change your treatment, we will call you to review the results.   Testing/Procedures:  None Ordered    Follow-Up: At [facility], you and your health needs are our priority.  As part of our continuing mission to provide you with exceptional heart care, we have created designated Provider Care Teams.  These Care Teams include your primary Cardiologist (physician) and Advanced Practice Providers (APPs -  Physician Assistants and Nurse Practitioners) who all work together to provide you with the care you need, when you need it.  We recommend signing up for the patient portal called "MyChart".  Sign up information is provided on this After Visit Summary.  MyChart is used to connect with patients for Virtual Visits (Telemedicine).  Patients are able to view lab/test results, encounter notes, upcoming appointments, etc.  Non-urgent messages can be sent to your provider as well.   To learn more about what you can do with MyChart, go to [email or web address].    Your next appointment:   12 month(s)  Provider:   You may see Brian Agbor-Etang, MD or one of the following Advanced Practice Providers on your designated Care Team:   Christopher Berge, NP Ryan Dunn, PA-C Cadence Furth, PA-C Sheri Hammock, NP  

## 2023-08-23 NOTE — Progress Notes (Signed)
Cardiology Office Note:    Date:  08/23/2023   ID:  Kelly Woods, DOB [birth year], MRN [id number]  PCP:  Bethanie Dicker, NP   [facility] Providers Cardiologist:  Debbe Odea, MD     Referring MD: Bethanie Dicker, NP   Chief Complaint  Patient presents with   Follow-up    Last seen in clinic in 05/2021 with plan to follow up in 1 year.  Patient denies new or acute cardiac problems/concerns today.        History of Present Illness:    Kelly Woods is a 47 y.o. female with history of PVCs, sinus bradycardia presenting for follow-up.    Last seen in 2022 due to symptoms of irregular heartbeat.  Cardiac monitor placed 04/2021 showed occasional PVCs 2.5% burden.  Average heart rate 53 bpm.  Patient triggered events were associated with PVCs.  She feels well, denies chest pain or shortness of breath.  Had a hysterectomy 2 months ago successfully with no adverse effects.  Feels well, denies syncope.  Past Medical History:  Diagnosis Date   Bradycardia    History of chicken pox    History of iron deficiency anemia    History of seizure    as a child   IUD (intrauterine device) in place    Ovarian mass, left    PVC's (premature ventricular contractions)    pt feels pvc's every night once she lies down   Raynaud phenomenon     Past Surgical History:  Procedure Laterality Date   COLPOSCOPY  2007   ROBOTIC ASSISTED TOTAL HYSTERECTOMY WITH BILATERAL SALPINGO OOPHERECTOMY N/A 06/21/2023   Procedure: XI ROBOTIC ASSISTED TOTAL HYSTERECTOMY WITH Bilateral SALPINGECTOMY, and left oophorectomy;  Surgeon: Artelia Laroche, MD;  Location: [facility] ORS;  Service: Gynecology;  Laterality: N/A;    Current Medications: Current Meds  Medication Sig   Multiple Vitamins-Minerals (MULTIVITAMIN ADULT PO) Take 1 tablet by mouth daily at 6 (six) AM.     Allergies:   Patient has no known allergies.   Social History   Socioeconomic History   Marital status: Married    Spouse name:  Fayrene Fearing Rosanne Ashing)   Number of children: 2   Years of education: Bachelors degree   Highest education level: Not on file  Occupational History   Not on file  Tobacco Use   Smoking status: Never   Smokeless tobacco: Never  Vaping Use   Vaping status: Never Used  Substance and Sexual Activity   Alcohol use: Not Currently    Comment: 5 drinks weekly-has not been drinking since abdominal pain has started back in July   Drug use: Never   Sexual activity: Yes    Birth control/protection: I.U.D.    Comment: IUD - about 5 years ago  Other Topics Concern   Not on file  Social History Narrative   04/01/19   From: [place] originally    Living: with husband Fayrene Fearing and 2 kids   Work: [employer]      Family: 2 kids - Government social research officer and Ava      Enjoys: running - distance up to 1/2 marathon, hiking, geocashing       Exercise: running   Diet: cooks at home, reasonably well - overall doing well      Safety   Seat belts: Yes    Guns: No   Safe in relationships: Yes    Social Determinants of Corporate investment banker Strain: Low Risk  (04/01/2019)   Overall Financial Resource Strain (CARDIA)  Difficulty of Paying Living Expenses: Not hard at all  Food Insecurity: Not on file  Transportation Needs: Not on file  Physical Activity: Not on file  Stress: Not on file  Social Connections: Not on file     Family History: The patient's family history includes Breast cancer in her paternal aunt; Diabetes in her paternal aunt; Heart disease in her maternal grandfather; Hyperlipidemia in her father; Psoriasis in her mother; Sleep apnea in her mother.  ROS:   Please see the history of present illness.     All other systems reviewed and are negative.  EKGs/Labs/Other Studies Reviewed:    The following studies were reviewed today:  EKG Interpretation Date/Time:  Wednesday August 23 2023 10:05:47 EDT Ventricular Rate:  48 PR Interval:  144 QRS Duration:  74 QT Interval:  458 QTC Calculation: 409 R  Axis:   -19  Text Interpretation: Sinus bradycardia Confirmed by Debbe Odea ([id number]) on 08/23/2023 10:15:04 AM    Recent Labs: 03/23/2023: TSH 3.110 05/24/2023: ALT 20; BUN 12; Creatinine, Ser 0.66; Hemoglobin 13.4; Platelets 227; Potassium 4.0; Sodium 137  Recent Lipid Panel    Component Value Date/Time   CHOL 131 03/23/2023 1049   TRIG 63 03/23/2023 1049   HDL 46 03/23/2023 1049   CHOLHDL 2.8 03/23/2023 1049   LDLCALC 72 03/23/2023 1049     Risk Assessment/Calculations:          Physical Exam:    VS:  BP 116/76 (BP Location: Left Arm, Patient Position: Sitting, Cuff Size: Normal)   Pulse (!) 48   Ht 5\' 6"  (1.676 m)   Wt 128 lb (58.1 kg)   SpO2 100%   BMI 20.66 kg/m     Wt Readings from Last 3 Encounters:  08/23/23 128 lb (58.1 kg)  08/08/23 128 lb 12.8 oz (58.4 kg)  07/26/23 126 lb 9.6 oz (57.4 kg)     GEN:  Well nourished, well developed in no acute distress HEENT: Normal NECK: No JVD; No carotid bruits CARDIAC: RRR, no murmurs, rubs, gallops RESPIRATORY:  Clear to auscultation without rales, wheezing or rhonchi  ABDOMEN: Soft, non-tender, non-distended MUSCULOSKELETAL:  No edema; No deformity  SKIN: Warm and dry NEUROLOGIC:  Alert and oriented x 3 PSYCHIATRIC:  Normal affect   ASSESSMENT:    1. Bradycardia   2. PVC (premature ventricular contraction)    PLAN:    In order of problems listed above:  Sinus bradycardia, patient with history of sinus bradycardia.  Otherwise asymptomatic.  Cardiac monitor previously placed showed no high degree AV block.  Occasional PVCs 2.5% burden.  Continue to monitor.  No indication for pacemaker at this time.  Echo 07/2021 showed normal EF 55 to 60%.  Follow-up yearly.     Medication Adjustments/Labs and Tests Ordered: Current medicines are reviewed at length with the patient today.  Concerns regarding medicines are outlined above.  Orders Placed This Encounter  Procedures   EKG 12-Lead     No orders of  the defined types were placed in this encounter.    Patient Instructions  Medication Instructions:   Your physician recommends that you continue on your current medications as directed. Please refer to the Current Medication list given to you today.  *If you need a refill on your cardiac medications before your next appointment, please call your pharmacy*   Lab Work:  None Ordered  If you have labs (blood work) drawn today and your tests are completely normal, you will receive your results only by:  MyChart Message (if you have MyChart) OR A paper copy in the mail If you have any lab test that is abnormal or we need to change your treatment, we will call you to review the results.   Testing/Procedures:  None Ordered   Follow-Up: At [facility], you and your health needs are our priority.  As part of our continuing mission to provide you with exceptional heart care, we have created designated Provider Care Teams.  These Care Teams include your primary Cardiologist (physician) and Advanced Practice Providers (APPs -  Physician Assistants and Nurse Practitioners) who all work together to provide you with the care you need, when you need it.  We recommend signing up for the patient portal called "MyChart".  Sign up information is provided on this After Visit Summary.  MyChart is used to connect with patients for Virtual Visits (Telemedicine).  Patients are able to view lab/test results, encounter notes, upcoming appointments, etc.  Non-urgent messages can be sent to your provider as well.   To learn more about what you can do with MyChart, go to [email or web address].    Your next appointment:   12 month(s)  Provider:   You may see Debbe Odea, MD or one of the following Advanced Practice Providers on your designated Care Team:   Nicolasa Ducking, NP Eula Listen, PA-C Cadence Fransico Michael, PA-C Charlsie Quest, NP   Signed, Debbe Odea, MD  08/23/2023 11:49 AM     [facility]

## 2023-09-07 HISTORY — PX: OOPHORECTOMY: SHX86

## 2023-10-04 ENCOUNTER — Inpatient Hospital Stay: Payer: Managed Care, Other (non HMO)

## 2023-10-04 ENCOUNTER — Inpatient Hospital Stay: Payer: Managed Care, Other (non HMO) | Attending: Obstetrics and Gynecology | Admitting: Obstetrics and Gynecology

## 2023-10-04 VITALS — BP 110/73 | HR 62 | Temp 97.6°F | Resp 20 | Wt 128.7 lb

## 2023-10-04 DIAGNOSIS — Z90710 Acquired absence of both cervix and uterus: Secondary | ICD-10-CM | POA: Diagnosis not present

## 2023-10-04 DIAGNOSIS — Z90721 Acquired absence of ovaries, unilateral: Secondary | ICD-10-CM | POA: Diagnosis not present

## 2023-10-04 DIAGNOSIS — C569 Malignant neoplasm of unspecified ovary: Secondary | ICD-10-CM

## 2023-10-04 DIAGNOSIS — Z780 Asymptomatic menopausal state: Secondary | ICD-10-CM | POA: Diagnosis not present

## 2023-10-04 DIAGNOSIS — Z7189 Other specified counseling: Secondary | ICD-10-CM | POA: Diagnosis not present

## 2023-10-04 DIAGNOSIS — Z8543 Personal history of malignant neoplasm of ovary: Secondary | ICD-10-CM | POA: Diagnosis present

## 2023-10-04 DIAGNOSIS — Z9889 Other specified postprocedural states: Secondary | ICD-10-CM | POA: Insufficient documentation

## 2023-10-04 MED ORDER — ESTRADIOL 0.75 MG/0.75GM TD GEL: 0.7500 g | Freq: Every day | TRANSDERMAL | 5 refills | Status: DC | PRN

## 2023-10-04 MED ORDER — ESTRADIOL 0.075 MG/24HR TD PTWK: 0.0750 mg | MEDICATED_PATCH | TRANSDERMAL | 6 refills | Status: DC

## 2023-10-04 NOTE — Progress Notes (Signed)
Gynecologic Oncology Interval Note  Referring Provider: Dr Birdie Sons  Chief Concern: Postoperative evaluation and discussion Subjective:  Kelly Woods is a 47 y.o. female who is seen in consultation from Dr. Birdie Sons for large pelvic mass.   On 09/07/2023 she underwent exam under anesthesia, diagnostic laparoscopy, pelvic washings, peritoneal biopsies, partial omentectomy, right oophorectomy, and appendectomy (staging for mucinous neoplasm)   Pathology  DIAGNOSIS  A.  Peritoneum, anterior cul-de-sac, biopsy:    No evidence of malignancy.   B.  Peritoneum, right pelvic, biopsy:    No evidence of malignancy.   C.  Peritoneum, posterior cul-de-sac, biopsy:    No evidence of malignancy.   D.  Peritoneum, left pelvic, biopsy:    No evidence of malignancy.   E.  Vaginal cuff, biopsy:    No evidence of malignancy.   F.  Iliac vein, left pelvic, biopsy:    No evidence of malignancy.   G.  Peritoneum, left pericolic gutter, biopsy:    No evidence of malignancy.    H.  Peritoneum, right pericolic gutter, biopsy:    No evidence of malignancy.   I.  Omentum, omentectomy:    No evidence of malignancy.    J.  Right ovary, oophorectomy:    No evidence of malignancy.    K.  Appendix, appendectomy:   Appendix with fibrous obliteration of lumen. No evidence of malignancy.     Pelvic Washing Diagnostic Interpretation A NEGATIVE. NO EVIDENCE OF MALIGNANCY.   Postoperatively she called with concerns regarding the appearance of her umbilicus.   Gynecologic Oncology History   Saw Dr Birdie Sons 05/11/23. "Patient reports abdominal pain starting 05/13/2023. Felt a twisty feeling in her lower abdomen at bedtime and was quite uncomfortable overnight. Laying down made it worse. She is more comfortable sitting up. Bothers her when she is up moving around.She did have some diarrhea though she notes this following taking a laxative so she is not sure if the laxative cause diarrhea  or for that is related to her abdominal pain. No nausea or vomiting. No vaginal bleeding or vaginal discharge. No blood in her stool. No blood in her urine. No dysuria. No urinary frequency. No urinary urgency. No fevers. She has not IUD in place. No abdominal surgeries in the past per her report. She typically has regular bowel movements."  UA microscopic blood, culture negative.  Referral to urology placed.   ASCUS PAP, HPV negative earlier this year.  Mirena IUD in place, no menses.   NSVD x 2  CT scan 05/16/23 FINDINGS: Reproductive: IUD present in the fundal endometrial cavity. Large multicystic mass in the central and posterior pelvis, which appears to arise from the left ovary or adnexa, measuring at least 11.6 x 8.5 x 8.5 cm (series 2, image 68, series 4, image 80).   Pelvic US 05/17/23 FINDINGS: right ovary Measurements: 3.7 x 1.9 x 2.1 cm = volume: 7.72 mL. Small hypoechoic structure with diffuse internal echoes within the right ovary measures 1.6 x 1.0 x 1.5 cm.   Left ovary Measurements: 13.4 x 6.8 x 9.7 cm = volume: 460.6 mL. There is a large complex multiloculated cystic mass within the left ovary which measures 7.9 x 6.9 x 9.1 cm.    06/21/2023 underwent an Exam under anesthesia, Robotic assisted total hysterectomy, unilateral oophorectomy  and bilateral salpingectomy.  Her operative pathology was benign.  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  At the time of her  surgery her appendix was noted to be normal.  Washings were obtained prior to rupture.  Pathology  Diagnosis 1. 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 - SEE CANCER SUMMARY BELOW. - FALLOPIAN TUBE WITH FIMBRIATED END; NEGATIVE FOR MALIGNANCY. 2. Uterus and cervix, Right tube - CERVIX WITH NABOTHIAN CYSTS. - ENDOMETRIUM WITH DECIDUALIZED STROMA CONSISTENT WITH PROGESTIN  EFFECT. - UNREMARKABLE MYOMETRIUM. - RIGHT FALLOPIAN TUBE WITH FIMBRIATED END AND PARATUBAL CYST. - NEGATIVE FOR MALIGNANCY. 3. Adhesions, Left pelvic - FIBROUS TISSUE WITH VASCULAR CONGESTION AND REACTIVE MESOTHELIUM. - NEGATIVE FOR MALIGNANCY. Microscopic Comment 1. CASE SUMMARY: (OVARY or FALLOPIAN TUBE or PRIMARY PERITONEUM) Standard(s): AJCC-UICC 8, FIGO Cancer Report 2021 SPECIMEN Procedure: Total hysterectomy, bilateral salpingectomy, left oophorectomy Specimen Integrity: Left ovary integrity: Capsule ruptured TUMOR Tumor Site: Left ovary Tumor Size: Greatest dimension 15.2 cm Histologic Type: Mucinous borderline tumor with microinvasion Histologic Grade: Not applicable Ovarian Surface Involvement: Not applicable Fallopian Tube Surface Involvement: Not applicable Implants: Not applicable Other Tissue/ Organ Involvement: Not identified Largest Extrapelvic Peritoneal Focus: Not applicable   Peritoneal/Ascitic Fluid Involvement: Malignant cells present  Her pathology was reviewed at [facility] and we discussed her case at tumor board. [facility] review: A.  Outside consult, [phone], [facility], [place], [place].  Date of procedure 06/21/23, designated as:     "1.Ovary and fallopian tube, left":   Ovary: Mucinous borderline tumor with microinvasion. Fallopian tube: No significant histopathologic abnormality. See comment.   B.  Outside consult, [id number], Same as above.  Date of procedure 06/21/23, designated as:      "Pelvic wash": Atypical/Inconclusive. Rare atypical cells present. See comment.   Tumor board options: discuss oophorectomy on the right ovary versus observation with CEA and pelvic ultrasounds.    She opted to proceed with surgery.     Past Medical History:  Diagnosis Date   Bradycardia    History of chicken pox    History of iron deficiency anemia    History of seizure    as a child   IUD (intrauterine device) in place    Ovarian  mass, left    PVC's (premature ventricular contractions)    pt feels pvc's every night once she lies down   Raynaud phenomenon     Past Surgical History:  Procedure Laterality Date   COLPOSCOPY  2007   OOPHORECTOMY Right 09/07/2023   Exam under anesthesia, diagnostic laparoscopy, pelvic washings, peritoneal biopsies, partial omentectomy, right oophorectomy, and appendectomy (staging for mucinous neoplasm)   ROBOTIC ASSISTED TOTAL HYSTERECTOMY WITH BILATERAL SALPINGO OOPHERECTOMY N/A 06/21/2023   Procedure: XI ROBOTIC ASSISTED TOTAL HYSTERECTOMY WITH Bilateral SALPINGECTOMY, and left oophorectomy;  Surgeon: Kelly Laroche, MD;  Location: [facility] ORS;  Service: Gynecology;  Laterality: N/A;     OB History     Gravida  2   Para  2   Term  2   Preterm      AB      Living  2      SAB      IAB      Ectopic      Multiple      Live Births  2             Family History  Problem Relation Age of Onset   Psoriasis Mother    Sleep apnea Mother    Hyperlipidemia Father    Heart disease Maternal Grandfather        by-pass surgery   Diabetes Paternal Aunt  Breast cancer Paternal Aunt        40's?     No Known Allergies   Current Outpatient Medications on File Prior to Visit  Medication Sig Dispense Refill   Multiple Vitamins-Minerals (MULTIVITAMIN ADULT PO) Take 1 tablet by mouth daily at 6 (six) AM.     ibuprofen (ADVIL) 600 MG tablet Take 1 tablet (600 mg total) by mouth every 6 (six) hours. (Patient not taking: Reported on 07/26/2023) 30 tablet 0   ondansetron (ZOFRAN-ODT) 4 MG disintegrating tablet Take 1 tablet (4 mg total) by mouth every 6 (six) hours as needed for nausea. (Patient not taking: Reported on 07/26/2023) 20 tablet 0   oxyCODONE-acetaminophen (PERCOCET) 5-325 MG tablet Take 1 tablet by mouth every 6 (six) hours as needed (breakthrough pain). (Patient not taking: Reported on 07/26/2023) 20 tablet 0   No current facility-administered  medications on file prior to visit.      Review of Systems General: no complaints  HEENT: no complaints  Lungs: no complaints  Cardiac: no complaints  GI: no complaints  GU: no complaints  Musculoskeletal: no complaints  Extremities: no complaints  Skin: no complaints  Neuro: no complaints  Endocrine: no complaints  Psych: no complaints         Objective:   Today's Vitals   10/04/23 1336  BP: 110/73  Pulse: 62  Resp: 20  Temp: 97.6 F (36.4 C)  SpO2: 100%  Weight: 128 lb 11.2 oz (58.4 kg)   Body mass index is 20.77 kg/m.    GENERAL: Patient is a well appearing female in no acute distress LUNGS:   normal respiratory effort  ABDOMEN:  Soft, nontender.  Nondistended no masses no hernias no ascites.  EXTREMITIES:  No peripheral edema.   SKIN: Incisions are all clear dry and intact. There is still suture present in the umbilical incision NEURO:  Nonfocal. Well oriented.  Appropriate affect.  Pelvic: EGBUS: no lesions Cervix: Surgically absent Vagina: no lesions, no discharge or bleeding. Cuff healing well. Sutures are palpated. Persistent indurated nontender vaginal cuff.  Uterus: Surgically absent Adnexa: no palpable masses    Lab Review Lab Results  Component Value Date   WBC 5.0 05/24/2023   HGB 13.4 05/24/2023   HCT 41.8 05/24/2023   MCV 85.1 05/24/2023   PLT 227 05/24/2023     Chemistry      Component Value Date/Time   NA 137 05/24/2023 1004   NA 140 05/16/2023 0956   K 4.0 05/24/2023 1004   CL 103 05/24/2023 1004   CO2 27 05/24/2023 1004   BUN 12 05/24/2023 1004   BUN 11 05/16/2023 0956   CREATININE 0.66 05/24/2023 1004      Component Value Date/Time   CALCIUM 9.5 05/24/2023 1004   ALKPHOS 66 05/24/2023 1004   AST 24 05/24/2023 1004   ALT 20 05/24/2023 1004   BILITOT 0.7 05/24/2023 1004   BILITOT 0.7 05/16/2023 0956           Assessment:  Pleasant Sklenar is a 47 y.o. 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   Surgical induced menopause  Medical co-morbidities complicating care: Raynaud's bradycardia (she is a runner), irregular heart beat occasionally (PVCs).  Plan:   1. Mucinous cystadenoma, borderline malignancy with microinvasion 2. Counseling and coordination of care      We discussed surveillance plan and recommended surveillance every 3 months with  exam and tumor markers for 2 years then every 6 months for 3 additional years then annually for five years.    I recommended ERT with topical estrogen patch (0.075%) and gel. Information was provided today and prescriptions will be sent.   Intercourse precautions provided.  I recommended genetic testing with reflex counseling if needed.    The patient's diagnosis, an outline of the further diagnostic and laboratory studies which will be required, the recommendation, and alternatives were discussed.  All questions were answered to the patient's satisfaction.  A total of 20 minutes were spent with the patient/family today; 50% was spent in education, counseling and coordination of care for pelvic mass.    Wymon Swaney Leta Jungling, MD     CC:  Glori Luis, MD [address] [place],  [place] [address] [phone]

## 2023-10-06 ENCOUNTER — Encounter: Payer: Self-pay | Admitting: Gastroenterology

## 2023-10-09 ENCOUNTER — Encounter: Payer: Self-pay | Admitting: Gastroenterology

## 2023-10-09 ENCOUNTER — Ambulatory Visit: Payer: Managed Care, Other (non HMO)

## 2023-10-09 ENCOUNTER — Encounter
Admission: RE | Disposition: A | Discharge status: Home / Self Care | Payer: Self-pay | Source: Home / Self Care | Attending: Gastroenterology

## 2023-10-09 ENCOUNTER — Ambulatory Visit
Admission: RE | Admit: 2023-10-09 | Discharge: 2023-10-09 | Disposition: A | Discharge status: Home / Self Care | Payer: Managed Care, Other (non HMO) | Attending: Gastroenterology | Admitting: Gastroenterology

## 2023-10-09 ENCOUNTER — Encounter: Payer: Self-pay | Admitting: Obstetrics and Gynecology

## 2023-10-09 DIAGNOSIS — Z1211 Encounter for screening for malignant neoplasm of colon: Secondary | ICD-10-CM

## 2023-10-09 HISTORY — PX: COLONOSCOPY WITH PROPOFOL: SHX5780

## 2023-10-09 SURGERY — COLONOSCOPY WITH PROPOFOL
Anesthesia: General

## 2023-10-09 MED ORDER — STERILE WATER FOR IRRIGATION IR SOLN
Status: DC | PRN
  Administered 2023-10-09: 100 mL

## 2023-10-09 MED ORDER — SODIUM CHLORIDE 0.9 % IV SOLN
INTRAVENOUS | Status: DC
  Administered 2023-10-09: 20 mL/h via INTRAVENOUS

## 2023-10-09 MED ORDER — PROPOFOL 500 MG/50ML IV EMUL
INTRAVENOUS | Status: DC | PRN
  Administered 2023-10-09: 200 ug/kg/min via INTRAVENOUS
  Administered 2023-10-09: 150 ug/kg/min via INTRAVENOUS
  Administered 2023-10-09: 70 mg via INTRAVENOUS

## 2023-10-09 MED ORDER — LIDOCAINE HCL (PF) 2 % IJ SOLN
INTRAMUSCULAR | Status: AC
  Filled 2023-10-09: qty 5

## 2023-10-09 MED ORDER — PROPOFOL 1000 MG/100ML IV EMUL
INTRAVENOUS | Status: AC
  Filled 2023-10-09: qty 100

## 2023-10-09 NOTE — Anesthesia Preprocedure Evaluation (Signed)
Anesthesia Evaluation  Patient identified by MRN, date of birth, ID band Patient awake    Reviewed: Allergy & Precautions, NPO status , Patient's Chart, lab work & pertinent test results  History of Anesthesia Complications Negative for: history of anesthetic complications  Airway Mallampati: II  TM Distance: >3 FB Neck ROM: full    Dental no notable dental hx.    Pulmonary neg pulmonary ROS   Pulmonary exam normal        Cardiovascular Normal cardiovascular exam+ dysrhythmias (PVCs, bradycardia)      Neuro/Psych negative neurological ROS  negative psych ROS   GI/Hepatic negative GI ROS, Neg liver ROS,,,  Endo/Other  negative endocrine ROS    Renal/GU negative Renal ROS  negative genitourinary   Musculoskeletal   Abdominal   Peds  Hematology negative hematology ROS (+)   Anesthesia Other Findings Past Medical History: No date: Bradycardia No date: History of chicken pox No date: History of iron deficiency anemia No date: History of seizure     Comment:  as a child No date: IUD (intrauterine device) in place No date: Ovarian mass, left No date: PVC's (premature ventricular contractions)     Comment:  pt feels pvc's every night once she lies down No date: Raynaud phenomenon  Past Surgical History: 2007: COLPOSCOPY 09/07/2023: OOPHORECTOMY; Right     Comment:  Exam under anesthesia, diagnostic laparoscopy, pelvic               washings, peritoneal biopsies, partial omentectomy, right              oophorectomy, and appendectomy (staging for mucinous               neoplasm) 06/21/2023: ROBOTIC ASSISTED TOTAL HYSTERECTOMY WITH BILATERAL  SALPINGO OOPHERECTOMY; N/A     Comment:  Procedure: XI ROBOTIC ASSISTED TOTAL HYSTERECTOMY WITH               Bilateral SALPINGECTOMY, and left oophorectomy;  Surgeon:              Artelia Laroche, MD;  Location: [facility] ORS;                Service: Gynecology;   Laterality: N/A;  BMI    Body Mass Index: 19.79 kg/m      Reproductive/Obstetrics negative OB ROS                             Anesthesia Physical Anesthesia Plan  ASA: 2  Anesthesia Plan: General   Post-op Pain Management: Minimal or no pain anticipated   Induction: Intravenous  PONV Risk Score and Plan: 2 and Propofol infusion and TIVA  Airway Management Planned: Natural Airway and Nasal Cannula  Additional Equipment:   Intra-op Plan:   Post-operative Plan:   Informed Consent: I have reviewed the patients History and Physical, chart, labs and discussed the procedure including the risks, benefits and alternatives for the proposed anesthesia with the patient or authorized representative who has indicated his/her understanding and acceptance.     Dental Advisory Given  Plan Discussed with: Anesthesiologist, CRNA and Surgeon  Anesthesia Plan Comments: (Patient consented for risks of anesthesia including but not limited to:  - adverse reactions to medications - risk of airway placement if required - damage to eyes, teeth, lips or other oral mucosa - nerve damage due to positioning  - sore throat or hoarseness - Damage to heart, brain, nerves, lungs, other parts of body  or loss of life  Patient voiced understanding and assent.)       Anesthesia Quick Evaluation

## 2023-10-09 NOTE — H&P (Signed)
Arlyss Repress, MD [address]  [address]  [place], [place] [address]  Main: [phone]  Fax: [phone] Pager: [phone]  Primary Care Physician:  Bethanie Dicker, NP Primary Gastroenterologist:  Dr. Arlyss Repress  Pre-Procedure History & Physical: HPI:  Kelly Woods is a 47 y.o. female is here for an colonoscopy.   Past Medical History:  Diagnosis Date   Bradycardia    History of chicken pox    History of iron deficiency anemia    History of seizure    as a child   IUD (intrauterine device) in place    Ovarian mass, left    PVC's (premature ventricular contractions)    pt feels pvc's every night once she lies down   Raynaud phenomenon     Past Surgical History:  Procedure Laterality Date   COLPOSCOPY  2007   OOPHORECTOMY Right 09/07/2023   Exam under anesthesia, diagnostic laparoscopy, pelvic washings, peritoneal biopsies, partial omentectomy, right oophorectomy, and appendectomy (staging for mucinous neoplasm)   ROBOTIC ASSISTED TOTAL HYSTERECTOMY WITH BILATERAL SALPINGO OOPHERECTOMY N/A 06/21/2023   Procedure: XI ROBOTIC ASSISTED TOTAL HYSTERECTOMY WITH Bilateral SALPINGECTOMY, and left oophorectomy;  Surgeon: Artelia Laroche, MD;  Location: [facility] ORS;  Service: Gynecology;  Laterality: N/A;    Prior to Admission medications   Medication Sig Start Date End Date Taking? Authorizing Provider  estradiol (CLIMARA - DOSED IN MG/24 HR) 0.075 mg/24hr patch Place 1 patch (0.075 mg total) onto the skin once a week. 10/04/23  Yes Alinda Dooms, NP  Estradiol 0.75 MG/0.75GM GEL Place 0.75 g onto the skin daily as needed (for vasomotor symptoms associated with menopause). 10/04/23  Yes Alinda Dooms, NP  Multiple Vitamins-Minerals (MULTIVITAMIN ADULT PO) Take 1 tablet by mouth daily at 6 (six) AM.   Yes [provider]  ibuprofen (ADVIL) 600 MG tablet Take 1 tablet (600 mg total) by mouth every 6 (six) hours. Patient not taking: Reported on 07/26/2023  06/21/23   Conard Novak, MD  ondansetron (ZOFRAN-ODT) 4 MG disintegrating tablet Take 1 tablet (4 mg total) by mouth every 6 (six) hours as needed for nausea. Patient not taking: Reported on 07/26/2023 06/21/23   Conard Novak, MD  oxyCODONE-acetaminophen (PERCOCET) 5-325 MG tablet Take 1 tablet by mouth every 6 (six) hours as needed (breakthrough pain). Patient not taking: Reported on 07/26/2023 06/21/23   Conard Novak, MD    Allergies as of 03/24/2023   (No Known Allergies)    Family History  Problem Relation Age of Onset   Psoriasis Mother    Sleep apnea Mother    Hyperlipidemia Father    Heart disease Maternal Grandfather        by-pass surgery   Diabetes Paternal Aunt    Breast cancer Paternal Aunt        33's?    Social History   Socioeconomic History   Marital status: Married    Spouse name: Fayrene Fearing Rosanne Ashing)   Number of children: 2   Years of education: Bachelors degree   Highest education level: Not on file  Occupational History   Not on file  Tobacco Use   Smoking status: Never   Smokeless tobacco: Never  Vaping Use   Vaping status: Never Used  Substance and Sexual Activity   Alcohol use: Yes    Comment: occasional   Drug use: Never   Sexual activity: Yes    Birth control/protection: I.U.D.    Comment: IUD - about 5 years ago  Other Topics Concern   Not on file  Social History Narrative   04/01/19   From: [place] originally    Living: with husband Fayrene Fearing and 2 kids   Work: [employer]      Family: 2 kids - Government social research officer and Ava      Enjoys: running - distance up to 1/2 marathon, hiking, geocashing       Exercise: running   Diet: cooks at home, reasonably well - overall doing well      Safety   Seat belts: Yes    Guns: No   Safe in relationships: Yes    Social Determinants of Corporate investment banker Strain: Low Risk  (04/01/2019)   Overall Financial Resource Strain (CARDIA)    Difficulty of Paying Living Expenses: Not hard at all  Food  Insecurity: Not on file  Transportation Needs: Not on file  Physical Activity: Not on file  Stress: Not on file  Social Connections: Not on file  Intimate Partner Violence: Not on file    Review of Systems: See HPI, otherwise negative ROS  Physical Exam: BP 99/69   Pulse (!) 58   Temp (!) 96.2 F (35.7 C) (Temporal)   Resp 20   Ht 5\' 6"  (1.676 m)   Wt 55.6 kg   SpO2 100%   BMI 19.79 kg/m  General:   Alert,  pleasant and cooperative in NAD Head:  Normocephalic and atraumatic. Neck:  Supple; no masses or thyromegaly. Lungs:  Clear throughout to auscultation.    Heart:  Regular rate and rhythm. Abdomen:  Soft, nontender and nondistended. Normal bowel sounds, without guarding, and without rebound.   Neurologic:  Alert and  oriented x4;  grossly normal neurologically.  Impression/Plan: Kelly Woods is here for an colonoscopy to be performed for colon cancer screening  Risks, benefits, limitations, and alternatives regarding  colonoscopy have been reviewed with the patient.  Questions have been answered.  All parties agreeable.   Lannette Donath, MD  10/09/2023, 7:55 AM

## 2023-10-09 NOTE — Transfer of Care (Signed)
Immediate Anesthesia Transfer of Care Note  Patient: Kelly Woods  Procedure(s) Performed: COLONOSCOPY WITH PROPOFOL  Patient Location: PACU  Anesthesia Type:General  Level of Consciousness: drowsy  Airway & Oxygen Therapy: Patient Spontanous Breathing  Post-op Assessment: Report given to RN and Post -op Vital signs reviewed and stable  Post vital signs: Reviewed and stable  Last Vitals:  Vitals Value Taken Time  BP 88/62 10/09/23 0826  Temp    Pulse 64 10/09/23 0827  Resp    SpO2 100 % 10/09/23 0827  Vitals shown include unfiled device data.  Last Pain:  Vitals:   10/09/23 0646  TempSrc: Temporal  PainSc: 0-No pain         Complications: There were no known notable events for this encounter.

## 2023-10-09 NOTE — Anesthesia Postprocedure Evaluation (Signed)
Anesthesia Post Note  Patient: Kelly Woods  Procedure(s) Performed: COLONOSCOPY WITH PROPOFOL  Patient location during evaluation: Endoscopy Anesthesia Type: General Level of consciousness: awake and alert Pain management: pain level controlled Vital Signs Assessment: post-procedure vital signs reviewed and stable Respiratory status: spontaneous breathing, nonlabored ventilation, respiratory function stable and patient connected to nasal cannula oxygen Cardiovascular status: blood pressure returned to baseline and stable Postop Assessment: no apparent nausea or vomiting Anesthetic complications: no   There were no known notable events for this encounter.   Last Vitals:  Vitals:   10/09/23 0836 10/09/23 0846  BP: 100/67 100/62  Pulse: 60 (!) 49  Resp:    Temp:    SpO2: 100% 100%    Last Pain:  Vitals:   10/09/23 0846  TempSrc:   PainSc: 0-No pain                 Louie Boston

## 2023-10-09 NOTE — Op Note (Signed)
[facility] Gastroenterology Patient Name: Kelly Woods Procedure Date: 10/09/2023 7:19 AM MRN: [id number] Account #: [id number] Date of Birth: [birth year] Admit Type: Outpatient Age: 47 Room: [facility] ENDO ROOM 4 Gender: Female Note Status: Finalized Instrument Name: Peds Colonoscope [id number] Procedure:             Colonoscopy Indications:           Screening for colorectal malignant neoplasm, This is                         the patient's first colonoscopy Providers:             Toney Reil MD, MD Referring MD:          Bethanie Dicker (Referring MD) Medicines:             General Anesthesia Complications:         No immediate complications. Estimated blood loss: None. Procedure:             Pre-Anesthesia Assessment:                        - Prior to the procedure, a History and Physical was                         performed, and patient medications and allergies were                         reviewed. The risks and benefits of the procedure and                         the sedation options and risks were discussed with the                         patient. All questions were answered and informed                         consent was obtained. Patient identification and                         proposed procedure were verified by the physician, the                         nurse, the anesthesiologist, the anesthetist and the                         technician in the pre-procedure area in the procedure                         room in the endoscopy suite. Mental Status                         Examination: alert and oriented. Airway Examination:                         normal oropharyngeal airway and neck mobility.                         Respiratory Examination: clear to auscultation. CV  Examination: normal. Prophylactic Antibiotics: The                         patient does not require prophylactic antibiotics.                         Prior  Anticoagulants: The patient has taken no                         anticoagulant or antiplatelet agents. ASA Grade                         Assessment: II - A patient with mild systemic disease.                         After reviewing the risks and benefits, the patient                         was deemed in satisfactory condition to undergo the                         procedure. The anesthesia plan was to use general                         anesthesia. Immediately prior to administration of                         medications, the patient was re-assessed for adequacy                         to receive sedatives. The heart rate, respiratory                         rate, oxygen saturations, blood pressure, adequacy of                         pulmonary ventilation, and response to care were                         monitored throughout the procedure. The physical                         status of the patient was re-assessed after the                         procedure.                        After obtaining informed consent, the colonoscope was                         passed under direct vision. Throughout the procedure,                         the patient's blood pressure, pulse, and oxygen                         saturations were monitored continuously. The  Colonoscope was introduced through the anus and                         advanced to the the cecum, identified by appendiceal                         orifice and ileocecal valve. The colonoscopy was                         performed without difficulty. The patient tolerated                         the procedure well. The quality of the bowel                         preparation was evaluated using the BBPS [facility] Bowel                         Preparation Scale) with scores of: Right Colon = 3,                         Transverse Colon = 3 and Left Colon = 3 (entire mucosa                         seen well with no residual  staining, small fragments                         of stool or opaque liquid). The total BBPS score                         equals 9. The ileocecal valve, appendiceal orifice,                         and rectum were photographed. Findings:      The perianal and digital rectal examinations were normal. Pertinent       negatives include normal sphincter tone and no palpable rectal lesions.      The entire examined colon appeared normal.      The retroflexed view of the distal rectum and anal verge was normal and       showed no anal or rectal abnormalities. Impression:            - The entire examined colon is normal.                        - The distal rectum and anal verge are normal on                         retroflexion view.                        - No specimens collected. Recommendation:        - Discharge patient to home (with escort).                        - Resume previous diet today.                        -  Continue present medications.                        - Repeat colonoscopy in 10 years for screening                         purposes. Procedure Code(s):     --- Professional ---                        A5409, Colorectal cancer screening; colonoscopy on                         individual not meeting criteria for high risk Diagnosis Code(s):     --- Professional ---                        Z12.11, Encounter for screening for malignant neoplasm                         of colon CPT copyright 2022 American Medical Association. All rights reserved. The codes documented in this report are preliminary and upon coder review may  be revised to meet current compliance requirements. Dr. Libby Maw Toney Reil MD, MD 10/09/2023 8:23:49 AM This report has been signed electronically. Number of Addenda: 0 Note Initiated On: 10/09/2023 7:19 AM Scope Withdrawal Time: 0 hours 9 minutes 35 seconds  Total Procedure Duration: 0 hours 14 minutes 34 seconds  Estimated Blood Loss:   Estimated blood loss: none.      [facility]

## 2023-10-10 ENCOUNTER — Encounter: Payer: Self-pay | Admitting: Gastroenterology

## 2023-10-17 ENCOUNTER — Encounter: Payer: Self-pay | Admitting: Licensed Clinical Social Worker

## 2023-10-17 DIAGNOSIS — Z1379 Encounter for other screening for genetic and chromosomal anomalies: Secondary | ICD-10-CM | POA: Insufficient documentation

## 2023-12-19 ENCOUNTER — Encounter: Payer: Self-pay | Admitting: Nurse Practitioner

## 2023-12-20 ENCOUNTER — Other Ambulatory Visit: Payer: Self-pay | Admitting: Nurse Practitioner

## 2023-12-20 MED ORDER — ESTRADIOL 0.75 MG/0.75GM TD GEL: 0.7500 g | Freq: Every day | TRANSDERMAL | 2 refills | Status: AC | PRN

## 2023-12-20 MED ORDER — ESTRADIOL 0.075 MG/24HR TD PTWK: 0.0750 mg | MEDICATED_PATCH | TRANSDERMAL | 3 refills | Status: AC

## 2023-12-20 NOTE — Progress Notes (Signed)
Estradiol gel and patch prescriptions sent to [facility] at patient request.

## 2023-12-27 ENCOUNTER — Telehealth: Payer: Self-pay | Admitting: *Deleted

## 2023-12-27 NOTE — Telephone Encounter (Signed)
 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 Also called [facility] and they will make a note of this

## 2024-01-10 ENCOUNTER — Inpatient Hospital Stay: Payer: Managed Care, Other (non HMO) | Attending: Obstetrics and Gynecology | Admitting: Nurse Practitioner

## 2024-01-10 ENCOUNTER — Inpatient Hospital Stay: Payer: Managed Care, Other (non HMO)

## 2024-01-10 VITALS — BP 107/75 | HR 48 | Temp 97.6°F | Resp 20 | Wt 132.6 lb

## 2024-01-10 DIAGNOSIS — I7300 Raynaud's syndrome without gangrene: Secondary | ICD-10-CM | POA: Insufficient documentation

## 2024-01-10 DIAGNOSIS — Z8543 Personal history of malignant neoplasm of ovary: Secondary | ICD-10-CM | POA: Diagnosis present

## 2024-01-10 DIAGNOSIS — I493 Ventricular premature depolarization: Secondary | ICD-10-CM | POA: Diagnosis not present

## 2024-01-10 DIAGNOSIS — C569 Malignant neoplasm of unspecified ovary: Secondary | ICD-10-CM

## 2024-01-10 DIAGNOSIS — Z79890 Hormone replacement therapy (postmenopausal): Secondary | ICD-10-CM | POA: Diagnosis not present

## 2024-01-10 DIAGNOSIS — Z5181 Encounter for therapeutic drug level monitoring: Secondary | ICD-10-CM | POA: Diagnosis not present

## 2024-01-10 DIAGNOSIS — Z803 Family history of malignant neoplasm of breast: Secondary | ICD-10-CM | POA: Diagnosis not present

## 2024-01-10 DIAGNOSIS — R232 Flushing: Secondary | ICD-10-CM | POA: Insufficient documentation

## 2024-01-10 NOTE — Progress Notes (Signed)
 Gynecologic Oncology Interval Note  Referring Provider: Dr Birdie Sons  Chief Concern: Ovarian Mucinous Borderline Tumor Surveillance Subjective:  Kelly Woods is a 48 y.o. female who is seen in consultation from Dr. 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   She feels well. Denies pelvic pain, changes in bowel movements, or vaginal bleeding. Urination is normal. Denies unintentional weight loss. She continues hormonal therapy for surgical menopause. Still has some headaches, fatigue, and dry eyes. Symptoms aren't particularly bothersome. Has rare hot flash but feels her symptoms are improved with hormonal therapy. Denies vaginal dryness. Using lubricants. Libido is reduced but not bothersome.   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 Colon was normal. Rectum and anal verge were normal. No specimens collected. Repeat 09/2033.   Mammogram- 03/31/23- Bi-Rads Category 1: negative.  CA 125 05/24/23 23.2 08/15/23 5.3  CEA 05/24/23 7.7 (elevated) 08/15/23 1.3  CA 19-9 05/24/23 113 (elevated) 08/15/23 5   Gynecologic Oncology History  Saw Dr Birdie Sons 05/11/23. "Patient reports abdominal pain starting 05/13/2023. Felt a twisty feeling in her lower abdomen at bedtime and was quite uncomfortable overnight. Laying down made it worse. She is more comfortable sitting up. Bothers her when she is up moving around.She did have some diarrhea though she notes this following taking a laxative so she is not sure if the laxative cause diarrhea or for that is related to her abdominal pain. No nausea or vomiting. No vaginal bleeding or vaginal discharge. No  blood in her stool. No blood in her urine. No dysuria. No urinary frequency. No urinary urgency. No fevers. She has IUD in place. No abdominal surgeries in the past per her report. She typically has regular bowel movements."  UA microscopic blood, culture negative.  Referral to urology placed.   ASCUS PAP, HPV negative earlier this year.  Mirena IUD in place, no menses.   NSVD x 2  CT scan 05/16/23 FINDINGS: Reproductive: IUD present in the fundal endometrial cavity. Large multicystic mass in the central and posterior pelvis, which appears to arise from the left ovary or adnexa, measuring at least 11.6 x 8.5 x 8.5 cm (series 2, image 68, series 4, image 80).   Pelvic US 05/17/23 FINDINGS: right ovary Measurements: 3.7 x 1.9 x 2.1 cm = volume: 7.72 mL. Small hypoechoic structure with diffuse internal echoes within the right ovary measures 1.6 x 1.0 x 1.5 cm.   Left ovary Measurements: 13.4 x 6.8 x 9.7 cm = volume: 460.6 mL. There is a large complex multiloculated cystic mass within the left ovary which measures 7.9 x 6.9 x 9.1 cm.    06/21/2023 underwent an Exam under anesthesia, Robotic assisted total hysterectomy, unilateral oophorectomy  and bilateral salpingectomy.  Her operative pathology was benign.  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  At the time of her surgery her appendix was noted to be normal.  Washings were obtained prior to rupture.  Pathology  Diagnosis 1. 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 - SEE CANCER SUMMARY BELOW. - FALLOPIAN TUBE WITH FIMBRIATED END; NEGATIVE FOR MALIGNANCY. 2. Uterus and cervix, Right tube - CERVIX WITH NABOTHIAN CYSTS. 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 - UNREMARKABLE MYOMETRIUM. - RIGHT FALLOPIAN TUBE WITH FIMBRIATED END AND PARATUBAL CYST. - NEGATIVE FOR  MALIGNANCY. 3. Adhesions, Left pelvic - FIBROUS TISSUE WITH VASCULAR CONGESTION AND REACTIVE MESOTHELIUM. - NEGATIVE FOR MALIGNANCY. Microscopic Comment 1. CASE SUMMARY: (OVARY or FALLOPIAN TUBE or PRIMARY PERITONEUM) Standard(s): AJCC-UICC 8, FIGO Cancer Report 2021 SPECIMEN Procedure: Total hysterectomy, bilateral salpingectomy, left oophorectomy Specimen Integrity: Left ovary integrity: Capsule ruptured TUMOR Tumor Site: Left ovary Tumor Size: Greatest dimension 15.2 cm Histologic Type: Mucinous borderline tumor with microinvasion Histologic Grade: Not applicable Ovarian Surface Involvement: Not applicable Fallopian Tube Surface Involvement: Not applicable Implants: Not applicable Other Tissue/ Organ Involvement: Not identified Largest Extrapelvic Peritoneal Focus: Not applicable  Peritoneal/Ascitic Fluid Involvement: Malignant cells present  Her pathology was reviewed at [facility] and we discussed her case at tumor board. [facility] review: A.  Outside consult, [phone], [facility], [place], [place].  Date of procedure 06/21/23, designated as:  "1.Ovary and fallopian tube, left": Ovary: Mucinous borderline tumor with microinvasion. Fallopian tube: No significant histopathologic abnormality.   B.  Outside consult, [id number], Same as above.  Date of procedure 06/21/23, designated as:  "Pelvic wash": Atypical/Inconclusive. Rare atypical cells present. See comment.  Tumor board options: discuss oophorectomy on the right ovary versus observation with CEA and pelvic ultrasounds.    She opted to proceed with surgery.   On 09/07/2023 she underwent exam under anesthesia, diagnostic laparoscopy, pelvic washings, peritoneal biopsies, partial omentectomy, right oophorectomy, and appendectomy (staging for mucinous neoplasm) with Dr. Sonia Side at [facility].   Pathology  A.  Peritoneum, anterior cul-de-sac, biopsy:   No evidence of malignancy. B.  Peritoneum, right pelvic, biopsy:    No evidence of malignancy. C.  Peritoneum, posterior cul-de-sac, biopsy:   No evidence of malignancy. D.  Peritoneum, left pelvic, biopsy:   No evidence of malignancy. E.  Vaginal cuff, biopsy:   No evidence of malignancy. F.  Iliac vein, left pelvic, biopsy:   No evidence of malignancy. G.  Peritoneum, left pericolic gutter, biopsy:   No evidence of malignancy.  H.  Peritoneum, right pericolic gutter, biopsy:   No evidence of malignancy. I.  Omentum, omentectomy:   No evidence of malignancy.  J.  Right ovary, oophorectomy:   No evidence of malignancy.  K.  Appendix, appendectomy: Appendix with fibrous obliteration of lumen. No evidence of malignancy.  Pelvic Washing Diagnostic Interpretation A NEGATIVE. NO EVIDENCE OF MALIGNANCY.   Postoperatively she called with concerns regarding the appearance of her umbilicus.   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)   Past Medical History:  Diagnosis Date   Bradycardia    History of chicken pox    History of iron deficiency anemia    History of seizure    as a child   IUD (intrauterine device) in place    Ovarian mass, left    PVC's (premature ventricular contractions)    pt feels pvc's every night once she lies down   Raynaud phenomenon     Past Surgical History:  Procedure Laterality Date   COLONOSCOPY WITH PROPOFOL N/A 10/09/2023   Procedure: COLONOSCOPY WITH PROPOFOL;  Surgeon: Toney Reil, MD;  Location: [facility] ENDOSCOPY;  Service: Gastroenterology;  Laterality: N/A;   COLPOSCOPY  2007   OOPHORECTOMY Right 09/07/2023   Exam under anesthesia, diagnostic laparoscopy, pelvic washings, peritoneal biopsies, partial omentectomy, right oophorectomy, and appendectomy (staging for mucinous neoplasm)   ROBOTIC ASSISTED TOTAL HYSTERECTOMY WITH  BILATERAL SALPINGO OOPHERECTOMY N/A 06/21/2023   Procedure: XI ROBOTIC ASSISTED TOTAL HYSTERECTOMY WITH  Bilateral SALPINGECTOMY, and left oophorectomy;  Surgeon: Artelia Laroche, MD;  Location: [facility] ORS;  Service: Gynecology;  Laterality: N/A;   OB History     Gravida  2   Para  2   Term  2   Preterm      AB      Living  2      SAB      IAB      Ectopic      Multiple      Live Births  2          Family History  Problem Relation Age of Onset   Psoriasis Mother    Sleep apnea Mother    Hyperlipidemia Father    Heart disease Maternal Grandfather        by-pass surgery   Diabetes Paternal Aunt    Breast cancer Paternal Aunt        21's?   No Known Allergies  Current Outpatient Medications on File Prior to Visit  Medication Sig Dispense Refill   estradiol (CLIMARA - DOSED IN MG/24 HR) 0.075 mg/24hr patch Place 1 patch (0.075 mg total) onto the skin once a week. 12 patch 3   Estradiol 0.75 MG/0.75GM GEL Place 0.75 g onto the skin daily as needed (for vasomotor symptoms associated with menopause). 90 each 2   Multiple Vitamins-Minerals (MULTIVITAMIN ADULT PO) Take 1 tablet by mouth daily at 6 (six) AM.     No current facility-administered medications on file prior to visit.    Review of Systems General:  no complaints Skin: no complaints Eyes: no complaints HEENT: no complaints Breasts: no complaints Pulmonary: no complaints Cardiac: no complaints Gastrointestinal: no complaints Genitourinary/Sexual: no complaints Ob/Gyn: no complaints Musculoskeletal: no complaints Hematology: no complaints Neurologic/Psych: no complaints   Objective:   Today's Vitals   01/10/24 0915  BP: 107/75  Pulse: (!) 48  Resp: 20  Temp: 97.6 F (36.4 C)  SpO2: 100%  Weight: 132 lb 9.6 oz (60.1 kg)  PainSc: 0-No pain   Body mass index is 21.4 kg/m.    GENERAL: Patient is a well appearing female in no acute distress. Thin build.  HEENT:  Sclera clear. Anicteric NODES:  Negative axillary, supraclavicular, inguinal lymph node survery LUNGS:  Clear to  auscultation bilaterally.   HEART:  Bradycardia. Regular rhythm.  ABDOMEN:  Soft, nontender.  No hernias, incisions well healed. No masses or ascites EXTREMITIES:  No peripheral edema. Atraumatic. No cyanosis SKIN:  Clear with no obvious rashes or skin changes.  NEURO:  Nonfocal. Well oriented.  Appropriate affect.  Pelvic: Exam chaperoned by CMA.  EGBUS: no lesions Vagina: no lesions, discharge, or bleeding. Vaginal cuff is now well healed and previous induration has resolved.  Cervix, Uterus: surgically absent BME: smooth, no palpable masses RV: deferred.    Lab Review  CA 125 05/24/23 23.2 08/15/23 5.3  CEA 05/24/23 7.7 (elevated) 08/15/23 1.3  CA 19-9 05/24/23 113 (elevated) 08/15/23 5     Assessment:  Kelly Woods is a 48 y.o. 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 No adjuvant therapy recommended. No evidence of disease. Asymptomatic of recurrence. Tumor markers pending.   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  Plan:  1. Mucinous cystadenoma, borderline malignancy with microinvasion. We again reviewed NCCN guidelines for surveillance including visits every 2-4 mo for 2 years then 3-6 months for 3 years then annually thereafter. Exam including pelvic exam. CT C/A/P, MRI, PET based on symptoms, clinical findings, or tumor markers since they were elevated at diagnosis. She will continue to see her pcp for health and wellness and other cancer screenings.   2. Surgical Menopause- On HRT with estrogen patch (0.075%). She also has gel for episodes when patch may not be feasible. She has some ongoing hormonal/menopausal symptoms but they are mild and she prefers to hold off on dose adjustments for  now. No issues with intercourse currently. Using lubricants.   3. Genetics- No pathogenic variants identified on the [facility] Multi-Cancer+RNA panel. VUS in AXIN2 called c.1445C>T identified. The report date is 10/16/2023.   Disposition:  3 mo- lab, Dr Secord/gyn onc- [place]  The patient's diagnosis, an outline of the further diagnostic and laboratory studies which will be required, the recommendation, and alternatives were discussed.  All questions were answered to the patient's satisfaction.  Alinda Dooms, NP  CC:  Bethanie Dicker, NP

## 2024-01-11 ENCOUNTER — Encounter: Payer: Self-pay | Admitting: Nurse Practitioner

## 2024-01-11 LAB — CEA: CEA: 1.4 ng/mL (ref 0.0–4.7)

## 2024-01-11 LAB — CA 125: Cancer Antigen (CA) 125: 4.5 U/mL (ref 0.0–38.1)

## 2024-01-11 LAB — CANCER ANTIGEN 19-9: CA 19-9: 5 U/mL (ref 0–35)

## 2024-02-22 ENCOUNTER — Encounter: Payer: Self-pay | Admitting: Nurse Practitioner

## 2024-02-27 ENCOUNTER — Other Ambulatory Visit: Payer: Self-pay | Admitting: Nurse Practitioner

## 2024-02-27 DIAGNOSIS — C569 Malignant neoplasm of unspecified ovary: Secondary | ICD-10-CM

## 2024-02-27 DIAGNOSIS — R140 Abdominal distension (gaseous): Secondary | ICD-10-CM

## 2024-02-27 DIAGNOSIS — R634 Abnormal weight loss: Secondary | ICD-10-CM

## 2024-02-27 NOTE — Progress Notes (Signed)
 Spoke to patient by phone to discuss symptoms. They have been present for 2 weeks. She has changed diet, stopped running which seemed to worsen symptoms. Lost some weight due to not eating normally. Is moving her bowels normally. She reports compliance with her HRT. Currently just using gel as she was experiencing headaches with patch. Recommend CT c/a/p to evaluate for recurrent disease. Plan to check tumor markers and have her seen in gyn onc to review symptoms and discuss results. Patient in agreement.

## 2024-03-01 ENCOUNTER — Ambulatory Visit
Admission: RE | Admit: 2024-03-01 | Discharge: 2024-03-01 | Disposition: A | Discharge status: Home / Self Care | Source: Ambulatory Visit | Attending: Nurse Practitioner

## 2024-03-01 ENCOUNTER — Inpatient Hospital Stay: Attending: Obstetrics and Gynecology

## 2024-03-01 DIAGNOSIS — R634 Abnormal weight loss: Secondary | ICD-10-CM | POA: Insufficient documentation

## 2024-03-01 DIAGNOSIS — Z79899 Other long term (current) drug therapy: Secondary | ICD-10-CM | POA: Insufficient documentation

## 2024-03-01 DIAGNOSIS — R140 Abdominal distension (gaseous): Secondary | ICD-10-CM | POA: Diagnosis present

## 2024-03-01 DIAGNOSIS — Z79890 Hormone replacement therapy (postmenopausal): Secondary | ICD-10-CM | POA: Diagnosis not present

## 2024-03-01 DIAGNOSIS — D509 Iron deficiency anemia, unspecified: Secondary | ICD-10-CM | POA: Diagnosis not present

## 2024-03-01 DIAGNOSIS — C569 Malignant neoplasm of unspecified ovary: Secondary | ICD-10-CM | POA: Insufficient documentation

## 2024-03-01 DIAGNOSIS — I7300 Raynaud's syndrome without gangrene: Secondary | ICD-10-CM | POA: Insufficient documentation

## 2024-03-01 DIAGNOSIS — R001 Bradycardia, unspecified: Secondary | ICD-10-CM | POA: Insufficient documentation

## 2024-03-01 DIAGNOSIS — Z803 Family history of malignant neoplasm of breast: Secondary | ICD-10-CM | POA: Diagnosis not present

## 2024-03-01 DIAGNOSIS — Z8543 Personal history of malignant neoplasm of ovary: Secondary | ICD-10-CM | POA: Diagnosis present

## 2024-03-01 MED ORDER — IOHEXOL 300 MG/ML  SOLN
80.0000 mL | Freq: Once | INTRAMUSCULAR | Status: AC | PRN
  Administered 2024-03-01: 80 mL via INTRAVENOUS

## 2024-03-03 LAB — CA 125: Cancer Antigen (CA) 125: 5.8 U/mL (ref 0.0–38.1)

## 2024-03-03 LAB — CANCER ANTIGEN 19-9: CA 19-9: 6 U/mL (ref 0–35)

## 2024-03-03 LAB — CEA: CEA: 1.3 ng/mL (ref 0.0–4.7)

## 2024-03-06 ENCOUNTER — Inpatient Hospital Stay: Admitting: Obstetrics and Gynecology

## 2024-03-06 ENCOUNTER — Encounter: Payer: Self-pay | Admitting: Obstetrics and Gynecology

## 2024-03-06 VITALS — BP 112/76 | HR 53 | Temp 98.6°F | Resp 20 | Wt 127.7 lb

## 2024-03-06 DIAGNOSIS — R140 Abdominal distension (gaseous): Secondary | ICD-10-CM

## 2024-03-06 DIAGNOSIS — R634 Abnormal weight loss: Secondary | ICD-10-CM

## 2024-03-06 DIAGNOSIS — C569 Malignant neoplasm of unspecified ovary: Secondary | ICD-10-CM

## 2024-03-06 DIAGNOSIS — Z8543 Personal history of malignant neoplasm of ovary: Secondary | ICD-10-CM | POA: Diagnosis not present

## 2024-03-06 NOTE — Progress Notes (Unsigned)
 Gynecologic Oncology Interval Note  Referring Provider: Dr Lovetta Rucks  Chief Concern: Ovarian Mucinous Borderline Tumor with microinvasion Subjective:  Kelly Woods is a 48 y.o. female who is seen in consultation from Dr. 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   Symptoms new in the past couple of weeks. She has tried adjusting her diet and reducing physical activity without improvement in her symptoms. She continues hormonal therapy for surgical menopause. No N/V.  No new life stressors. Due to her symptoms, imaging and tumor markers were performed:   03/01/24- CT C/A/P w/ contrast FINDINGS: CT CHEST FINDINGS - Cardiovascular: No significant vascular findings. Normal heart size. No pericardial effusion. - Mediastinum/Nodes: No enlarged mediastinal, hilar, or axillary lymph nodes. Thyroid gland, trachea, and esophagus demonstrate no significant findings. - Lungs/Pleura: Biapical pleuroparenchymal scarring. No suspicious pulmonary nodules or masses. - Musculoskeletal: No aggressive lytic or blastic lesion of bone.   CT ABDOMEN PELVIS FINDINGS - Hepatobiliary: No suspicious hepatic lesion. Gallbladder is unremarkable. No biliary ductal dilation. - Pancreas: No pancreatic ductal dilation or evidence of acute inflammation. - Spleen: No splenomegaly. - Adrenals/Urinary Tract: Bilateral adrenal glands appear normal. No hydronephrosis. Kidneys demonstrate symmetric enhancement. Urinary bladder is unremarkable for degree of distension. - Stomach/Bowel: Stomach is within normal limits. Appendix appears normal. No evidence of bowel wall thickening, distention, or inflammatory changes. Colonic stool burden compatible with  constipation. - Vascular/Lymphatic: No significant vascular findings are present. No enlarged abdominal or pelvic lymph nodes. - Reproductive: Uterus is surgically absent without suspicious nodularity along the vaginal cuff. - Other: In the posterior pelvis adjacent to the rectum and loops of small bowel there is a small amount of loculated fluid/soft tissue measuring 2.1 x 1.0 cm on image 108/2 - Musculoskeletal: No aggressive lytic or blastic lesion of bone.   IMPRESSION: 1. In the posterior pelvis adjacent to the rectum and loops of small bowel there is a small amount of loculated fluid/soft tissue measuring 2.1 x 1.0 cm, nonspecific but in the setting of prior ovarian cancer is suspicious for disease involvement. Suggest more definitive characterization by pelvic MRI with and without contrast.  2. No evidence of metastatic disease in the chest. 3. Colonic stool burden compatible with constipation.   CA 125 05/24/23 23.2 08/15/23 5.3 01/10/24 4.5 03/01/24  5.8  CEA 05/24/23 7.7 (elevated) 08/15/23 1.3 01/10/24 1.4 03/01/24  1.3  CA 19-9 05/24/23 113 (elevated) 08/15/23 5 01/10/24 5 03/01/24  6  Colonoscopy- 10/09/23 with Dr Baldomero Bone. Colon was normal. Rectum and anal verge were normal. No specimens collected. Repeat 09/2033.   Mammogram- 03/31/23- Bi-Rads Category 1: negative.  Gynecologic Oncology History  Saw Dr Lovetta Rucks 05/11/23. "Patient reports abdominal pain starting 05/13/2023. Felt a twisty feeling in her lower abdomen at bedtime and was quite uncomfortable overnight. No abdominal surgeries in the past per her report.  UA microscopic blood, culture negative.  Referral to urology placed.   ASCUS PAP, HPV negative 2024.  Mirena  IUD in place, no menses.   NSVD x 2  CT scan 05/16/23 FINDINGS: Reproductive: IUD present in the fundal endometrial cavity. Large multicystic mass in the central and posterior pelvis, which appears to arise from the left ovary or adnexa, measuring at least  11.6 x 8.5 x 8.5 cm (series 2, image 68, series 4,  image 80).   Pelvic US  05/17/23 FINDINGS: right ovary Measurements: 3.7 x 1.9 x 2.1 cm = volume: 7.72 mL. Small hypoechoic structure with diffuse internal echoes within the right ovary measures 1.6 x 1.0 x 1.5 cm.   Left ovary Measurements: 13.4 x 6.8 x 9.7 cm = volume: 460.6 mL. There is a large complex multiloculated cystic mass within the left ovary which measures 7.9 x 6.9 x 9.1 cm.    06/21/2023 underwent an Exam under anesthesia, Robotic assisted total hysterectomy, unilateral oophorectomy  and bilateral salpingectomy with Drs. Cleora Daft and Secord at [facility].   Findings: Left adnexal mass measuring ~ 15 cm and adherent to the pelvic floor on the left with one adhesion. Normal tubes and right ovary. The upper abdomen was normal including omentum, appendix, bowel, liver, stomach, and diaphragmatic surfaces.  Tumor ruptured and spillage of clear fluid was aspirated with suction. Her operative pathology was benign. Her final pathology was consistent with a borderline mucinous tumor with microinvasion and washing concerning for metastatic cancer.Washings were obtained prior to rupture.  At the time of her surgery her appendix was noted to be normal.   Pathology  Diagnosis 1. 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 - FALLOPIAN TUBE WITH FIMBRIATED END; NEGATIVE FOR MALIGNANCY. 2. Uterus and cervix, Right tube - CERVIX WITH NABOTHIAN CYSTS. - ENDOMETRIUM WITH DECIDUALIZED STROMA CONSISTENT WITH PROGESTIN EFFECT. - UNREMARKABLE MYOMETRIUM. - RIGHT FALLOPIAN TUBE WITH FIMBRIATED END AND PARATUBAL CYST. - NEGATIVE FOR MALIGNANCY. 3. Adhesions, Left pelvic - FIBROUS TISSUE WITH VASCULAR CONGESTION AND REACTIVE MESOTHELIUM. - NEGATIVE FOR MALIGNANCY. Microscopic Comment 1. CASE SUMMARY: (OVARY or FALLOPIAN TUBE or PRIMARY PERITONEUM) Standard(s): AJCC-UICC 8, FIGO Cancer Report  2021 SPECIMEN Procedure: Total hysterectomy, bilateral salpingectomy, left oophorectomy Specimen Integrity: Left ovary integrity: Capsule ruptured TUMOR Tumor Site: Left ovary Tumor Size: Greatest dimension 15.2 cm Histologic Type: Mucinous borderline tumor with microinvasion Histologic Grade: Not applicable Ovarian Surface Involvement: Not applicable Fallopian Tube Surface Involvement: Not applicable Implants: Not applicable Other Tissue/ Organ Involvement: Not identified Largest Extrapelvic Peritoneal Focus: Not applicable  Peritoneal/Ascitic Fluid Involvement: Malignant cells present  Her pathology was reviewed at [facility] and we discussed her case at tumor board. [facility] review: A.  Outside consult, [phone], [facility], [place], [place].  Date of procedure 06/21/23, designated as:  "1.Ovary and fallopian tube, left": Ovary: Mucinous borderline tumor with microinvasion. Fallopian tube: No significant histopathologic abnormality.   B.  Outside consult, [id number], Same as above.  Date of procedure 06/21/23, designated as:  "Pelvic wash": Atypical/Inconclusive. Rare atypical cells present. See comment.  Tumor board options: discuss oophorectomy on the right ovary versus observation with CEA and pelvic ultrasounds. She opted to proceed with surgery.   On 09/07/2023 she underwent exam under anesthesia, diagnostic laparoscopy, pelvic washings, peritoneal biopsies, partial omentectomy, right oophorectomy, and appendectomy (staging for mucinous neoplasm) with Dr. Randalyn Bushman at [facility].   Pathology  A.  Peritoneum, anterior cul-de-sac, biopsy:   No evidence of malignancy. B.  Peritoneum, right pelvic, biopsy:   No evidence of malignancy. C.  Peritoneum, posterior cul-de-sac, biopsy:   No evidence of malignancy. D.  Peritoneum, left pelvic, biopsy:   No evidence of malignancy. E.  Vaginal cuff, biopsy:   No evidence of malignancy. F.  Iliac vein, left pelvic, biopsy:   No  evidence of malignancy. G.  Peritoneum, left pericolic gutter, biopsy:   No evidence of malignancy.  H.  Peritoneum, right pericolic gutter, biopsy:   No  evidence of malignancy. I.  Omentum, omentectomy:   No evidence of malignancy.  J.  Right ovary, oophorectomy:   No evidence of malignancy.  K.  Appendix, appendectomy: Appendix with fibrous obliteration of lumen. No evidence of malignancy.  Pelvic Washing Diagnostic Interpretation A NEGATIVE. NO EVIDENCE OF MALIGNANCY.   Postoperatively she called with concerns regarding the appearance of her umbilicus.   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)   Past Medical History:  Diagnosis Date   Bradycardia    History of chicken pox    History of iron deficiency anemia    History of seizure    as a child   IUD (intrauterine device) in place    Ovarian mass, left    PVC's (premature ventricular contractions)    pt feels pvc's every night once she lies down   Raynaud phenomenon     Past Surgical History:  Procedure Laterality Date   COLONOSCOPY WITH PROPOFOL  N/A 10/09/2023   Procedure: COLONOSCOPY WITH PROPOFOL ;  Surgeon: Selena Daily, MD;  Location: [facility] END[facility]COPY;  Service: Gastroenterology;  Laterality: N/A;   COLPOSCOPY  2007   OOPHORECTOMY Right 09/07/2023   Exam under anesthesia, diagnostic laparoscopy, pelvic washings, peritoneal biopsies, partial omentectomy, right oophorectomy, and appendectomy (staging for mucinous neoplasm)   ROBOTIC ASSISTED TOTAL HYSTERECTOMY WITH BILATERAL SALPINGO OOPHERECTOMY N/A 06/21/2023   Procedure: XI ROBOTIC ASSISTED TOTAL HYSTERECTOMY WITH Bilateral SALPINGECTOMY, and left oophorectomy;  Surgeon: Nobie Batch, MD;  Location: [facility] ORS;  Service: Gynecology;  Laterality: N/A;   OB History     Gravida  2   Para  2   Term  2   Preterm      AB      Living  2      SAB      IAB       Ectopic      Multiple      Live Births  2          Family History  Problem Relation Age of Onset   Psoriasis Mother    Sleep apnea Mother    Hyperlipidemia Father    Heart disease Maternal Grandfather        by-pass surgery   Diabetes Paternal Aunt    Breast cancer Paternal Aunt        47's?   No Known Allergies  Current Outpatient Medications on File Prior to Visit  Medication Sig Dispense Refill   estradiol  (CLIMARA  - DOSED IN MG/24 HR) 0.075 mg/24hr patch Place 1 patch (0.075 mg total) onto the skin once a week. 12 patch 3   Estradiol  0.75 MG/0.75GM GEL Place 0.75 g onto the skin daily as needed (for vasomotor symptoms associated with menopause). 90 each 2   Multiple Vitamins-Minerals (MULTIVITAMIN ADULT PO) Take 1 tablet by mouth daily at 6 (six) AM.     No current facility-administered medications on file prior to visit.   Review of Systems General:  no complaints Skin: no complaints Eyes: no complaints HEENT: no complaints Breasts: no complaints Pulmonary: no complaints Cardiac: no complaints Gastrointestinal: see interval history Genitourinary/Sexual: no complaints Ob/Gyn: no complaints Musculoskeletal: no complaints Hematology: no complaints Neurologic/Psych: no complaints  Objective:   Today's Vitals   03/06/24 0901  BP: 112/76  Pulse: (!) 53  Resp: 20  Temp: 98.6 F (37 C)  SpO2: 100%  Weight: 127 lb 11.2 oz (57.9 kg)    Body mass index is  20.61 kg/m.    GENERAL: Patient is a well appearing female in no acute distress HEENT:  Sclera clear. Anicteric NODES:  Negative axillary, supraclavicular, inguinal lymph node survery LUNGS:  Clear to auscultation bilaterally.   HEART:  Regular rate and rhythm.  ABDOMEN:  Soft, nontender.  No hernias, incisions well healed. No masses or ascites EXTREMITIES:  No peripheral edema. Atraumatic. No cyanosis SKIN:  Clear with no obvious rashes or skin changes.  NEURO:  Nonfocal. Well oriented.  Appropriate  affect.  Pelvic: Exam chaperoned by CMA.  EGBUS: no lesions Vagina: no lesions, discharge, or bleeding. Vaginal cuff is now well healed.  Cervix, Uterus: surgically absent BME: smooth, no palpable masses RV: normal, no masses.   Lab Review Per HPI  Radiology Review Per hpi    Assessment:  Kelly Woods is a 48 y.o. P2 female with stage IC3 mucinous borderline tumor.  Underwent Robotic assisted total hysterectomy, unilateral oophorectomy  and bilateral salpingectomy for 11.5 cm left ovarian mass in 8/24. Pathology showed mucinous borderline tumor with microinvasion and positive washings prior to tumor rupture.  Path review at [facility] in agreement with borderline tumor with microinvasion, but washings atypical/inconclusive. In 11/24 had completion RSO, appendectomy, and biopsies negative for malignancy and negative washings. No adjuvant therapy recommended by tumor board.   Increased bloating and pelvic pressure recently.  CT scan shows "In the posterior pelvis adjacent to the rectum and loops of small bowel there is a small amount of loculated fluid/soft tissue measuring 2.1 x 1.0 cm, nonspecific but in the setting of prior ovarian cancer is suspicious for disease involvement. CA125, CEA and CA-19-9 remain stable in the normal range.    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  Plan:   1. Mucinous borderline malignancy with microinvasion and atypical washings. New abdominal distention symptoms, but exam and tumor markers normal.  Small fluid collection or lesion 2 cm in posterior pelvis seems unlikely to be related to recurrence and does not correlate with her symptoms. Recommend GI medicine consult to explore the possibility of functional small bowel dysfunction or IBS.  Suggested she stop taking her multivitamin, her only oral medication. Will determine if radiology thinks  that the small fluid collection in the pelvis can be aspirated for cytology.  Will plan to see her back in 4-6 weeks and repeat exam and tumor markers.  - Case was discussed with Dr Randalyn Bushman who recommends PET to evaluate further for possible recurrence.   She will continue to see her pcp for health and wellness and other cancer screenings.   2. Surgical Menopause- On HRT with estrogen patch (0.075%). She also has gel for episodes when patch may not be feasible. She has some ongoing hormonal/menopausal symptoms but they are mild and she prefers to hold off on dose adjustments for now. No issues with intercourse currently. Using lubricants.   3. Genetics- No pathogenic variants identified on the Inv[facility]ae Multi-Cancer+RNA panel. VUS in AXIN2 called c.1445C>T identified. The report date is 10/16/2023.   The patient's diagnosis, an outline of the further diagnostic and laboratory studies which will be required, the recommendation, and alternatives were discussed.  All questions were answered to the patient's satisfaction.  Kenney Peacemaker, DNP, AGNP-C, AOCNP [facility] [phone] (clinic)  I personally interviewed and examined the patient. Agreed with the above/below plan of care. I have directly contributed to assessment and plan of care of  this patient and educated and discussed with patient and family.  Hermine Loots, MD  CC:  Bluford Burkitt, NP

## 2024-03-15 ENCOUNTER — Encounter
Admission: RE | Admit: 2024-03-15 | Discharge: 2024-03-15 | Disposition: A | Discharge status: Home / Self Care | Source: Ambulatory Visit | Attending: Nurse Practitioner | Admitting: Nurse Practitioner

## 2024-03-15 DIAGNOSIS — R140 Abdominal distension (gaseous): Secondary | ICD-10-CM | POA: Diagnosis present

## 2024-03-15 DIAGNOSIS — R634 Abnormal weight loss: Secondary | ICD-10-CM | POA: Insufficient documentation

## 2024-03-15 DIAGNOSIS — C569 Malignant neoplasm of unspecified ovary: Secondary | ICD-10-CM | POA: Insufficient documentation

## 2024-03-15 LAB — GLUCOSE, CAPILLARY: Glucose-Capillary: 84 mg/dL (ref 70–99)

## 2024-03-15 MED ORDER — FLUDEOXYGLUCOSE F - 18 (FDG) INJECTION
6.9400 | Freq: Once | INTRAVENOUS | Status: AC | PRN
  Administered 2024-03-15: 6.94 via INTRAVENOUS

## 2024-03-22 ENCOUNTER — Telehealth: Payer: Self-pay | Admitting: *Deleted

## 2024-03-22 NOTE — Telephone Encounter (Signed)
 Rn contacted radiology scheduling to get the pet scan read by Tuesday next week

## 2024-03-29 ENCOUNTER — Encounter: Payer: Self-pay | Admitting: Nurse Practitioner

## 2024-03-29 ENCOUNTER — Ambulatory Visit (INDEPENDENT_AMBULATORY_CARE_PROVIDER_SITE_OTHER): Payer: Managed Care, Other (non HMO) | Admitting: Nurse Practitioner

## 2024-03-29 VITALS — BP 102/68 | HR 51 | Temp 97.5°F | Ht 66.0 in | Wt 126.8 lb

## 2024-03-29 DIAGNOSIS — Z0000 Encounter for general adult medical examination without abnormal findings: Secondary | ICD-10-CM | POA: Insufficient documentation

## 2024-03-29 DIAGNOSIS — C569 Malignant neoplasm of unspecified ovary: Secondary | ICD-10-CM

## 2024-03-29 DIAGNOSIS — R42 Dizziness and giddiness: Secondary | ICD-10-CM

## 2024-03-29 DIAGNOSIS — Z13220 Encounter for screening for lipoid disorders: Secondary | ICD-10-CM

## 2024-03-29 DIAGNOSIS — R140 Abdominal distension (gaseous): Secondary | ICD-10-CM

## 2024-03-29 DIAGNOSIS — Z1329 Encounter for screening for other suspected endocrine disorder: Secondary | ICD-10-CM

## 2024-03-29 DIAGNOSIS — E8940 Asymptomatic postprocedural ovarian failure: Secondary | ICD-10-CM

## 2024-03-29 DIAGNOSIS — Z1231 Encounter for screening mammogram for malignant neoplasm of breast: Secondary | ICD-10-CM

## 2024-03-29 DIAGNOSIS — Z862 Personal history of diseases of the blood and blood-forming organs and certain disorders involving the immune mechanism: Secondary | ICD-10-CM

## 2024-03-29 DIAGNOSIS — Z79890 Hormone replacement therapy (postmenopausal): Secondary | ICD-10-CM

## 2024-03-29 NOTE — Progress Notes (Signed)
 Kelly Burkitt, NP-C Phone: [phone]  Kelly Woods is a 48 y.o. female who presents today for annual exam.   Discussed the use of AI scribe software for clinical note transcription with the patient, who gave verbal consent to proceed.  History of Present Illness   Kelly Woods is a 48 year old female who presents for annual exam with abdominal discomfort and bloating.  She has been experiencing ongoing abdominal discomfort and bloating for several months, described as a feeling of pressure that requires her to 'push out' her stomach or lean forward to alleviate the pain. The symptoms are inconsistent and unpredictable, impacting her daily activities, including her ability to run and eat normally. A PET scan on Mar 15, 2024, appeared normal, but she is concerned it did not address her stomach or intestines. A prior CT scan inaccurately reported an appendix, which she does not have, raising concerns about the accuracy of the scan. She has not yet received follow-up from gastroenterology despite a referral being placed. A colonoscopy in December 2024 was normal. She was diagnosed with borderline mucinous cystadenoma after a large pelvic mass was found in July 2024. She underwent a total hysterectomy and appendectomy. She is followed closely by Oncology and [facility].   She experiences frequent lightheadedness upon standing and has a history of low blood pressure and bradycardia. She is under the care of a cardiologist for these issues. Her blood pressure is consistently low, and she acknowledges a possible decrease in fluid intake due to reduced physical activity. No chest pain, shortness of breath, or palpitations.  Her appetite has decreased, and she sometimes feels full without eating. She is experimenting with new vegetables from a CSA box and generally avoids fast food. She has reduced her alcohol intake to a few times a week. She has a history of iron deficiency and is currently using  estrogen gel due to surgical menopause. She has not had her estrogen levels checked since starting the gel. No burning with urination, headaches, trouble swallowing, blood in stools, or changes in bowel movements.      Social History   Tobacco Use  Smoking Status Never  Smokeless Tobacco Never    Current Outpatient Medications on File Prior to Visit  Medication Sig Dispense Refill   estradiol  (CLIMARA  - DOSED IN MG/24 HR) 0.075 mg/24hr patch Place 1 patch (0.075 mg total) onto the skin once a week. 12 patch 3   Estradiol  0.75 MG/0.75GM GEL Place 0.75 g onto the skin daily as needed (for vasomotor symptoms associated with menopause). 90 each 2   Multiple Vitamins-Minerals (MULTIVITAMIN ADULT PO) Take 1 tablet by mouth daily at 6 (six) AM.     No current facility-administered medications on file prior to visit.     ROS see history of present illness  Objective  Physical Exam Vitals:   03/29/24 0812  BP: 102/68  Pulse: (!) 51  Temp: (!) 97.5 F (36.4 C)  SpO2: 97%    BP Readings from Last 3 Encounters:  03/29/24 102/68  03/06/24 112/76  01/10/24 107/75   Wt Readings from Last 3 Encounters:  03/29/24 126 lb 12.8 oz (57.5 kg)  03/06/24 127 lb 11.2 oz (57.9 kg)  01/10/24 132 lb 9.6 oz (60.1 kg)    Physical Exam Constitutional:      General: She is not in acute distress.    Appearance: Normal appearance.  HENT:     Head: Normocephalic.     Right Ear: Tympanic membrane normal.  Left Ear: Tympanic membrane normal.     Nose: Nose normal.     Mouth/Throat:     Mouth: Mucous membranes are moist.     Pharynx: Oropharynx is clear.  Eyes:     Conjunctiva/sclera: Conjunctivae normal.     Pupils: Pupils are equal, round, and reactive to light.  Neck:     Thyroid: No thyromegaly.  Cardiovascular:     Rate and Rhythm: Regular rhythm. Bradycardia present.     Heart sounds: Normal heart sounds.  Pulmonary:     Effort: Pulmonary effort is normal.     Breath sounds:  Normal breath sounds.  Abdominal:     General: Abdomen is flat. Bowel sounds are normal.     Palpations: Abdomen is soft.     Tenderness: There is no abdominal tenderness.  Musculoskeletal:        General: Normal range of motion.  Lymphadenopathy:     Cervical: No cervical adenopathy.  Skin:    General: Skin is warm and dry.     Findings: No rash.  Neurological:     General: No focal deficit present.     Mental Status: She is alert.  Psychiatric:        Mood and Affect: Mood normal.        Behavior: Behavior normal.     Assessment/Plan: Please see individual problem list.  Preventative health care Assessment & Plan: Physical exam complete. We will check lab work as outlined and contact patient with results. S/p total hysterectomy. Mammogram due, order placed for patient to schedule. Colonoscopy is up to date. Tetanus vaccine is up to date. She has received 5 COVID vaccines. Flu vaccine not due. Continue routine dental and eye exams. Encourage to continue healthy diet and regular exercise as tolerated. Return to care in one year, sooner as needed.   Orders: -     VITAMIN D  25 Hydroxy (Vit-D Deficiency, Fractures)  Abdominal bloating Assessment & Plan: Symptoms persist without cancer recurrence on recent imaging, suggesting a possible gastrointestinal cause not visible on scans. Verify PET scan and CT Abd/Pelvis results. Referral to GI pending from Oncology. We will place new urgent GI referral as patient is visibly uncomfortable and pain is affecting daily activities.   Orders: -     CBC with Differential/Platelet -     Comprehensive metabolic panel with GFR -     Ambulatory referral to Gastroenterology  Mucinous cystadenoma, borderline malignancy (HCC) Assessment & Plan: Follow up with Oncology/GynOnc as scheduled.   Orders: -     Ambulatory referral to Gastroenterology  Orthostatic dizziness Assessment & Plan: Frequent lightheadedness likely due to orthostatic  hypotension with consistently low blood pressure and possible dehydration. Increase fluid intake and consider electrolyte tablets to increase salt intake. Monitor blood pressure and symptoms. Follow up with Cardiology as scheduled.    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 Check estradiol  levels and continue using estrogen gel.  Orders: -     Estradiol   History of iron deficiency anemia -     Iron, TIBC and Ferritin Panel  Thyroid disorder screen -     TSH  Lipid screening -     Lipid panel  Screening mammogram for breast cancer -     3D Screening Mammogram, Left and Right; Future     Return in about 1 year (around 03/29/2025) for Annual Exam, sooner as needed.   Kelly Burkitt, NP-C [facility]

## 2024-03-30 LAB — COMPREHENSIVE METABOLIC PANEL WITH GFR
ALT: 32 IU/L (ref 0–32)
AST: 29 IU/L (ref 0–40)
Albumin: 4.9 g/dL (ref 3.9–4.9)
Alkaline Phosphatase: 80 IU/L (ref 44–121)
BUN/Creatinine Ratio: 17 (ref 9–23)
BUN: 15 mg/dL (ref 6–24)
Bilirubin Total: 0.6 mg/dL (ref 0.0–1.2)
CO2: 24 mmol/L (ref 20–29)
Calcium: 9.9 mg/dL (ref 8.7–10.2)
Chloride: 102 mmol/L (ref 96–106)
Creatinine, Ser: 0.89 mg/dL (ref 0.57–1.00)
Globulin, Total: 2.3 g/dL (ref 1.5–4.5)
Glucose: 88 mg/dL (ref 70–99)
Potassium: 4.6 mmol/L (ref 3.5–5.2)
Sodium: 142 mmol/L (ref 134–144)
Total Protein: 7.2 g/dL (ref 6.0–8.5)
eGFR: 81 mL/min/{1.73_m2} (ref >59)

## 2024-03-30 LAB — LIPID PANEL
Chol/HDL Ratio: 3.8 ratio (ref 0.0–4.4)
Cholesterol, Total: 180 mg/dL (ref 100–199)
HDL: 47 mg/dL (ref >39)
LDL Chol Calc (NIH): 115 mg/dL — ABNORMAL HIGH (ref 0–99)
Triglycerides: 97 mg/dL (ref 0–149)
VLDL Cholesterol Cal: 18 mg/dL (ref 5–40)

## 2024-03-30 LAB — CBC WITH DIFFERENTIAL/PLATELET
Basophils Absolute: 0 10*3/uL (ref 0.0–0.2)
Basos: 1 %
EOS (ABSOLUTE): 0 10*3/uL (ref 0.0–0.4)
Eos: 1 %
Hematocrit: 41.1 % (ref 34.0–46.6)
Hemoglobin: 12.8 g/dL (ref 11.1–15.9)
Immature Grans (Abs): 0 10*3/uL (ref 0.0–0.1)
Immature Granulocytes: 0 %
Lymphocytes Absolute: 1.3 10*3/uL (ref 0.7–3.1)
Lymphs: 29 %
MCH: 26.7 pg (ref 26.6–33.0)
MCHC: 31.1 g/dL — ABNORMAL LOW (ref 31.5–35.7)
MCV: 86 fL (ref 79–97)
Monocytes Absolute: 0.3 10*3/uL (ref 0.1–0.9)
Monocytes: 7 %
Neutrophils Absolute: 2.7 10*3/uL (ref 1.4–7.0)
Neutrophils: 62 %
Platelets: 198 10*3/uL (ref 150–450)
RBC: 4.79 x10E6/uL (ref 3.77–5.28)
RDW: 12.4 % (ref 11.7–15.4)
WBC: 4.4 10*3/uL (ref 3.4–10.8)

## 2024-03-30 LAB — IRON,TIBC AND FERRITIN PANEL
Ferritin: 178 ng/mL — ABNORMAL HIGH (ref 15–150)
Iron Saturation: 43 % (ref 15–55)
Iron: 135 ug/dL (ref 27–159)
Total Iron Binding Capacity: 314 ug/dL (ref 250–450)
UIBC: 179 ug/dL (ref 131–425)

## 2024-03-30 LAB — VITAMIN D 25 HYDROXY (VIT D DEFICIENCY, FRACTURES): Vit D, 25-Hydroxy: 36.6 ng/mL (ref 30.0–100.0)

## 2024-03-30 LAB — ESTRADIOL: Estradiol: 37.4 pg/mL

## 2024-03-30 LAB — TSH: TSH: 3.03 u[IU]/mL (ref 0.450–4.500)

## 2024-04-02 ENCOUNTER — Ambulatory Visit: Payer: Self-pay | Admitting: Nurse Practitioner

## 2024-04-04 ENCOUNTER — Encounter: Payer: Self-pay | Admitting: Nurse Practitioner

## 2024-04-04 DIAGNOSIS — R42 Dizziness and giddiness: Secondary | ICD-10-CM | POA: Insufficient documentation

## 2024-04-04 DIAGNOSIS — E8940 Asymptomatic postprocedural ovarian failure: Secondary | ICD-10-CM | POA: Insufficient documentation

## 2024-04-04 NOTE — Assessment & Plan Note (Signed)
 Follow up with Oncology/GynOnc as scheduled.

## 2024-04-04 NOTE — Assessment & Plan Note (Addendum)
 Frequent lightheadedness likely due to orthostatic hypotension with consistently low blood pressure and possible dehydration. Increase fluid intake and consider electrolyte tablets to increase salt intake. Monitor blood pressure and symptoms. Follow up with Cardiology as scheduled.

## 2024-04-04 NOTE — Assessment & Plan Note (Signed)
 Symptoms are managed with estrogen gel. Check estradiol  levels and continue using estrogen gel.

## 2024-04-04 NOTE — Assessment & Plan Note (Addendum)
 Physical exam complete. We will check lab work as outlined and contact patient with results. S/p total hysterectomy. Mammogram due, order placed for patient to schedule. Colonoscopy is up to date. Tetanus vaccine is up to date. She has received 5 COVID vaccines. Flu vaccine not due. Continue routine dental and eye exams. Encourage to continue healthy diet and regular exercise as tolerated. Return to care in one year, sooner as needed.

## 2024-04-04 NOTE — Patient Instructions (Signed)
 YOUR MAMMOGRAM IS DUE, PLEASE CALL AND GET THIS SCHEDULED! [facility] - call [phone]

## 2024-04-04 NOTE — Assessment & Plan Note (Signed)
 Symptoms persist without cancer recurrence on recent imaging, suggesting a possible gastrointestinal cause not visible on scans. Verify PET scan and CT Abd/Pelvis results. Referral to GI pending from Oncology. We will place new urgent GI referral as patient is visibly uncomfortable and pain is affecting daily activities.

## 2024-04-08 ENCOUNTER — Other Ambulatory Visit: Payer: Self-pay

## 2024-04-08 ENCOUNTER — Inpatient Hospital Stay: Attending: Obstetrics and Gynecology

## 2024-04-08 DIAGNOSIS — Z8543 Personal history of malignant neoplasm of ovary: Secondary | ICD-10-CM | POA: Diagnosis present

## 2024-04-08 DIAGNOSIS — C569 Malignant neoplasm of unspecified ovary: Secondary | ICD-10-CM

## 2024-04-09 LAB — CA 125: Cancer Antigen (CA) 125: 5.5 U/mL (ref 0.0–38.1)

## 2024-04-09 LAB — CANCER ANTIGEN 19-9: CA 19-9: 6 U/mL (ref 0–35)

## 2024-04-09 LAB — CEA: CEA: 1.5 ng/mL (ref 0.0–4.7)

## 2024-04-10 ENCOUNTER — Other Ambulatory Visit

## 2024-04-10 ENCOUNTER — Ambulatory Visit: Payer: Self-pay | Admitting: Obstetrics and Gynecology

## 2024-04-10 ENCOUNTER — Inpatient Hospital Stay

## 2024-04-11 ENCOUNTER — Ambulatory Visit
Admission: RE | Admit: 2024-04-11 | Discharge: 2024-04-11 | Disposition: A | Discharge status: Home / Self Care | Source: Ambulatory Visit | Attending: Nurse Practitioner | Admitting: Nurse Practitioner

## 2024-04-11 DIAGNOSIS — Z1231 Encounter for screening mammogram for malignant neoplasm of breast: Secondary | ICD-10-CM | POA: Diagnosis present

## 2024-05-15 ENCOUNTER — Ambulatory Visit: Admitting: Anesthesiology

## 2024-05-15 ENCOUNTER — Encounter: Payer: Self-pay | Admitting: Gastroenterology

## 2024-05-15 ENCOUNTER — Other Ambulatory Visit: Payer: Self-pay

## 2024-05-15 ENCOUNTER — Ambulatory Visit
Admission: RE | Admit: 2024-05-15 | Discharge: 2024-05-15 | Disposition: A | Discharge status: Home / Self Care | Attending: Gastroenterology | Admitting: Gastroenterology

## 2024-05-15 ENCOUNTER — Encounter
Admission: RE | Disposition: A | Discharge status: Home / Self Care | Payer: Self-pay | Source: Home / Self Care | Attending: Gastroenterology

## 2024-05-15 DIAGNOSIS — R768 Other specified abnormal immunological findings in serum: Secondary | ICD-10-CM | POA: Insufficient documentation

## 2024-05-15 DIAGNOSIS — R1013 Epigastric pain: Secondary | ICD-10-CM | POA: Insufficient documentation

## 2024-05-15 DIAGNOSIS — K2950 Unspecified chronic gastritis without bleeding: Secondary | ICD-10-CM | POA: Insufficient documentation

## 2024-05-15 DIAGNOSIS — R140 Abdominal distension (gaseous): Secondary | ICD-10-CM | POA: Diagnosis not present

## 2024-05-15 DIAGNOSIS — K3189 Other diseases of stomach and duodenum: Secondary | ICD-10-CM | POA: Diagnosis present

## 2024-05-15 DIAGNOSIS — K31A19 Gastric intestinal metaplasia without dysplasia, unspecified site: Secondary | ICD-10-CM | POA: Diagnosis not present

## 2024-05-15 HISTORY — PX: ESOPHAGOGASTRODUODENOSCOPY: SHX5428

## 2024-05-15 SURGERY — EGD (ESOPHAGOGASTRODUODENOSCOPY)
Anesthesia: General

## 2024-05-15 MED ORDER — PROPOFOL 10 MG/ML IV BOLUS
INTRAVENOUS | Status: DC | PRN
  Administered 2024-05-15: 70 mg via INTRAVENOUS
  Administered 2024-05-15 (×3): 20 mg via INTRAVENOUS

## 2024-05-15 MED ORDER — DEXMEDETOMIDINE HCL IN NACL 80 MCG/20ML IV SOLN
INTRAVENOUS | Status: DC | PRN
  Administered 2024-05-15: 8 ug via INTRAVENOUS

## 2024-05-15 MED ORDER — SODIUM CHLORIDE 0.9 % IV SOLN: INTRAVENOUS | Status: DC

## 2024-05-15 NOTE — Transfer of Care (Signed)
 Immediate Anesthesia Transfer of Care Note  Patient: Kelly Woods  Procedure(s) Performed: EGD (ESOPHAGOGASTRODUODENOSCOPY)  Patient Location: PACU  Anesthesia Type:General  Level of Consciousness: awake, alert , and oriented  Airway & Oxygen Therapy: Patient Spontanous Breathing  Post-op Assessment: Report given to RN and Post -op Vital signs reviewed and stable  Post vital signs: Reviewed and stable  Last Vitals:  Vitals Value Taken Time  BP 92/63 05/15/24 09:04  Temp    Pulse    Resp 16 05/15/24 09:04  SpO2    Vitals shown include unfiled device data.  Last Pain:  Vitals:   05/15/24 0730  PainSc: 0-No pain         Complications: No notable events documented.

## 2024-05-15 NOTE — Anesthesia Preprocedure Evaluation (Signed)
 Anesthesia Evaluation  Patient identified by MRN, date of birth, ID band Patient awake    Reviewed: Allergy & Precautions, NPO status , Patient's Chart, lab work & pertinent test results  History of Anesthesia Complications Negative for: history of anesthetic complications  Airway Mallampati: II  TM Distance: >3 FB Neck ROM: full    Dental no notable dental hx.    Pulmonary neg pulmonary ROS   Pulmonary exam normal        Cardiovascular (-) hypertension(-) angina (-) Past MI and (-) Cardiac Stents Normal cardiovascular exam+ dysrhythmias (PVCs, bradycardia) (-) Valvular Problems/Murmurs     Neuro/Psych Seizures - (as a child),   negative psych ROS   GI/Hepatic negative GI ROS, Neg liver ROS,,,  Endo/Other  negative endocrine ROS    Renal/GU negative Renal ROS  negative genitourinary   Musculoskeletal   Abdominal   Peds  Hematology negative hematology ROS (+)   Anesthesia Other Findings Past Medical History: No date: Bradycardia No date: History of chicken pox No date: History of iron deficiency anemia No date: History of seizure     Comment:  as a child No date: IUD (intrauterine device) in place No date: Ovarian mass, left No date: PVC's (premature ventricular contractions)     Comment:  pt feels pvc's every night once she lies down No date: Raynaud phenomenon  Past Surgical History: 2007: COLPOSCOPY 09/07/2023: OOPHORECTOMY; Right     Comment:  Exam under anesthesia, diagnostic laparoscopy, pelvic               washings, peritoneal biopsies, partial omentectomy, right              oophorectomy, and appendectomy (staging for mucinous               neoplasm) 06/21/2023: ROBOTIC ASSISTED TOTAL HYSTERECTOMY WITH BILATERAL  SALPINGO OOPHERECTOMY; N/A     Comment:  Procedure: XI ROBOTIC ASSISTED TOTAL HYSTERECTOMY WITH               Bilateral SALPINGECTOMY, and left oophorectomy;  Surgeon:               Elby Webb Loges, MD;  Location: [facility] ORS;                Service: Gynecology;  Laterality: N/A;  BMI    Body Mass Index: 19.79 kg/m      Reproductive/Obstetrics negative OB ROS                              Anesthesia Physical Anesthesia Plan  ASA: 2  Anesthesia Plan: General   Post-op Pain Management: Minimal or no pain anticipated   Induction: Intravenous  PONV Risk Score and Plan: 2 and Propofol  infusion and TIVA  Airway Management Planned: Natural Airway and Nasal Cannula  Additional Equipment:   Intra-op Plan:   Post-operative Plan:   Informed Consent: I have reviewed the patients History and Physical, chart, labs and discussed the procedure including the risks, benefits and alternatives for the proposed anesthesia with the patient or authorized representative who has indicated his/her understanding and acceptance.     Dental Advisory Given  Plan Discussed with: Anesthesiologist, CRNA and Surgeon  Anesthesia Plan Comments: (Patient consented for risks of anesthesia including but not limited to:  - adverse reactions to medications - risk of airway placement if required - damage to eyes, teeth, lips or other oral mucosa - nerve damage due to positioning  -  sore throat or hoarseness - Damage to heart, brain, nerves, lungs, other parts of body or loss of life  Patient voiced understanding and assent.)        Anesthesia Quick Evaluation

## 2024-05-15 NOTE — H&P (Signed)
 Kelly JONELLE Brooklyn, MD [facility], DHIP [address]  [place], [place] [address]  Main: [phone] Fax:  [phone] Pager: [phone]   Primary Care Physician:  [name], NP Primary Gastroenterologist:  Dr. Corinn JONELLE Woods  Pre-Procedure History & Physical: HPI:  Kelly Woods is a 48 y.o. female is here for an endoscopy.   Past Medical History:  Diagnosis Date   Anemia 1998   Bradycardia    Cancer (HCC) 2024   Ovarian   History of chicken pox    History of iron deficiency anemia    History of seizure    as a child   IUD (intrauterine device) in place    Ovarian mass, left    PVC's (premature ventricular contractions)    pt feels pvc's every night once she lies down   Raynaud phenomenon    Seizures (HCC)    childhood epilepsy    Past Surgical History:  Procedure Laterality Date   ABDOMINAL HYSTERECTOMY  2024   APPENDECTOMY  2024   COLONOSCOPY WITH PROPOFOL  N/A 10/09/2023   Procedure: COLONOSCOPY WITH PROPOFOL ;  Surgeon: Woods Kelly Skiff, MD;  Location: [facility] ENDOS[facility];  Service: Gastroenterology;  Laterality: N/A;   COLPOSCOPY  2007   OOPHORECTOMY Right 09/07/2023   Exam under anesthesia, diagnostic laparoscopy, pelvic washings, peritoneal biopsies, partial omentectomy, right oophorectomy, and appendectomy (staging for mucinous neoplasm)   ROBOTIC ASSISTED TOTAL HYSTERECTOMY WITH BILATERAL SALPINGO OOPHERECTOMY N/A 06/21/2023   Procedure: XI ROBOTIC ASSISTED TOTAL HYSTERECTOMY WITH Bilateral SALPINGECTOMY, and left oophorectomy;  Surgeon: Elby Webb Loges, MD;  Location: [facility] ORS;  Service: Gynecology;  Laterality: N/A;    Prior to Admission medications   Medication Sig Start Date End Date Taking? Authorizing Provider  estradiol  (CLIMARA  - DOSED IN MG/24 HR) 0.075 mg/24hr patch Place 1 patch (0.075 mg total) onto the skin once a week. 12/20/23   Dasie Tinnie MATSU, NP  Estradiol  0.75 MG/0.75GM GEL Place 0.75 g onto the skin  daily as needed (for vasomotor symptoms associated with menopause). 12/20/23   Dasie Tinnie MATSU, NP  Multiple Vitamins-Minerals (MULTIVITAMIN ADULT PO) Take 1 tablet by mouth daily at 6 (six) AM.    [provider]    Allergies as of 05/01/2024   (No Known Allergies)    Family History  Problem Relation Age of Onset   Psoriasis Mother    Sleep apnea Mother    Hyperlipidemia Father    Diabetes Brother    Heart disease Maternal Grandfather        by-pass surgery   Cancer Paternal Grandfather    Diabetes Paternal Aunt    Breast cancer Paternal Aunt        39's?   Arthritis Maternal Uncle    Cancer Paternal Aunt     Social History   Socioeconomic History   Marital status: Married    Spouse name: Kelly Woods)   Number of children: 2   Years of education: Bachelors degree   Highest education level: Not on file  Occupational History   Not on file  Tobacco Use   Smoking status: Never   Smokeless tobacco: Never  Vaping Use   Vaping status: Never Used  Substance and Sexual Activity   Alcohol use: Yes    Alcohol/week: 3.0 standard drinks of alcohol    Types: 1 Glasses of wine, 2 Cans of beer per week    Comment: occasional   Drug use: Never   Sexual activity: Yes    Birth control/protection:  Other-see comments    Comment: Surgically menopausal  Other Topics Concern   Not on file  Social History Narrative   04/01/19   From: [place] originally    Living: with husband Kelly and 2 kids   Work: [employer]      Family: 2 kids - Government social research officer and Ava      Enjoys: running - distance up to 1/2 marathon, hiking, geocashing       Exercise: running   Diet: cooks at home, reasonably well - overall doing well      Safety   Seat belts: Yes    Guns: No   Safe in relationships: Yes    Social Drivers of Corporate investment banker Strain: Low Risk  (04/25/2024)   Received from [facility]   Overall Financial Resource Strain (CARDIA)    Difficulty of Paying  Living Expenses: Not hard at all  Food Insecurity: No Food Insecurity (04/25/2024)   Received from [facility]   Hunger Vital Sign    Within the past 12 months, you worried that your food would run out before you got the money to buy more.: Never true    Within the past 12 months, the food you bought just didn't last and you didn't have money to get more.: Never true  Transportation Needs: No Transportation Needs (04/25/2024)   Received from [facility] - Transportation    In the past 12 months, has lack of transportation kept you from medical appointments or from getting medications?: No    Lack of Transportation (Non-Medical): No  Physical Activity: Not on file  Stress: Not on file  Social Connections: Not on file  Intimate Partner Violence: Not on file    Review of Systems: See HPI, otherwise negative ROS  Physical Exam: BP 107/86   Pulse (!) 48   Wt 57.2 kg   SpO2 100%   BMI 20.34 kg/m  General:   Alert,  pleasant and cooperative in NAD Head:  Normocephalic and atraumatic. Neck:  Supple; no masses or thyromegaly. Lungs:  Clear throughout to auscultation.    Heart:  Regular rate and rhythm. Abdomen:  Soft, nontender and nondistended. Normal bowel sounds, without guarding, and without rebound.   Neurologic:  Alert and  oriented x4;  grossly normal neurologically.  Impression/Plan: [name] is here for an endoscopy to be performed for abnormal celiac serologies  Risks, benefits, limitations, and alternatives regarding  endoscopy have been reviewed with the patient.  Questions have been answered.  All parties agreeable.   Kelly Brooklyn, MD  05/15/2024, 8:23 AM

## 2024-05-15 NOTE — Op Note (Signed)
 [facility] Patient Name: Kelly Woods Procedure Date: 05/15/2024 8:28 AM MRN: [id number] Account #: [id number] Date of Birth: 1976/02/13 Admit Type: Outpatient Age: 48 Room: [facility] ENDO ROOM 2 Gender: Female Note Status: Finalized Instrument Name: Upper Endoscope [id number] Procedure:             Upper GI endoscopy Indications:           Dyspepsia, Positive celiac serologies, Abdominal                         bloating Providers:             Corinn Jess Brooklyn MD, MD Referring MD:          Leron Glance (Referring MD) Medicines:             General Anesthesia Complications:         No immediate complications. Estimated blood loss: None. Procedure:             Pre-Anesthesia Assessment:                        - Prior to the procedure, a History and Physical was                         performed, and patient medications and allergies were                         reviewed. The patient is competent. The risks and                         benefits of the procedure and the sedation options and                         risks were discussed with the patient. All questions                         were answered and informed consent was obtained.                         Patient identification and proposed procedure were                         verified by the physician, the nurse, the                         anesthesiologist, the anesthetist and the technician                         in the pre-procedure area in the procedure room in the                         endoscopy suite. Mental Status Examination: alert and                         oriented. Airway Examination: normal oropharyngeal                         airway and neck mobility. Respiratory Examination:  clear to auscultation. CV Examination: normal.                         Prophylactic Antibiotics: The patient does not require                         prophylactic antibiotics. Prior  Anticoagulants: The                         patient has taken no anticoagulant or antiplatelet                         agents. ASA Grade Assessment: II - A patient with mild                         systemic disease. After reviewing the risks and                         benefits, the patient was deemed in satisfactory                         condition to undergo the procedure. The anesthesia                         plan was to use general anesthesia. Immediately prior                         to administration of medications, the patient was                         re-assessed for adequacy to receive sedatives. The                         heart rate, respiratory rate, oxygen saturations,                         blood pressure, adequacy of pulmonary ventilation, and                         response to care were monitored throughout the                         procedure. The physical status of the patient was                         re-assessed after the procedure.                        After obtaining informed consent, the endoscope was                         passed under direct vision. Throughout the procedure,                         the patient's blood pressure, pulse, and oxygen                         saturations were monitored continuously. The Endoscope  was introduced through the mouth, and advanced to the                         second part of duodenum. The upper GI endoscopy was                         accomplished without difficulty. The patient tolerated                         the procedure well. Findings:      The duodenal bulb was normal. Biopsies for histology were taken with a       cold forceps for evaluation of celiac disease.      The entire examined stomach was normal. Biopsies were taken with a cold       forceps for Helicobacter pylori testing.      The gastroesophageal junction and examined esophagus were normal.      Diffuse mildly scalloped  mucosa was found in the second portion of the       duodenum. Biopsies for histology were taken with a cold forceps for       evaluation of celiac disease. Estimated blood loss: none. Impression:            - Normal duodenal bulb. Biopsied.                        - Normal stomach. Biopsied.                        - Normal gastroesophageal junction and esophagus.                        - Scalloped mucosa was found in the duodenum, rule out                         celiac disease. Biopsied. Recommendation:        - Discharge patient to home (with escort).                        - Gluten free diet indefinitely.                        - Continue present medications.                        - Await pathology results.                        - Return to my office as previously scheduled. Procedure Code(s):     --- Professional ---                        [phone], Esophagogastroduodenoscopy, flexible,                         transoral; with biopsy, single or multiple Diagnosis Code(s):     --- Professional ---                        K31.89, Other diseases of stomach and duodenum  R76.8, Other specified abnormal immunological findings                         in serum                        R10.13, Epigastric pain                        R14.0, Abdominal distension (gaseous) CPT copyright 2022 American Medical Association. All rights reserved. The codes documented in this report are preliminary and upon coder review may  be revised to meet current compliance requirements. Dr. Corinn Brooklyn Corinn Jess Brooklyn MD, MD 05/15/2024 9:01:43 AM This report has been signed electronically. Number of Addenda: 0 Note Initiated On: 05/15/2024 8:28 AM Estimated Blood Loss:  Estimated blood loss: none. Estimated blood loss: none.      [facility]

## 2024-05-15 NOTE — Anesthesia Postprocedure Evaluation (Signed)
 Anesthesia Post Note  Patient: Kelly Woods  Procedure(s) Performed: EGD (ESOPHAGOGASTRODUODENOSCOPY)  Patient location during evaluation: Endoscopy Anesthesia Type: General Level of consciousness: awake and alert Pain management: pain level controlled Vital Signs Assessment: post-procedure vital signs reviewed and stable Respiratory status: spontaneous breathing, nonlabored ventilation, respiratory function stable and patient connected to nasal cannula oxygen Cardiovascular status: blood pressure returned to baseline and stable Postop Assessment: no apparent nausea or vomiting Anesthetic complications: no   No notable events documented.   Last Vitals:  Vitals:   05/15/24 0923 05/15/24 0924  BP:  91/71  Pulse: (!) 45 (!) 43  Resp: 11 12  Temp:  (!) 36.1 C  SpO2: 100% 100%    Last Pain:  Vitals:   05/15/24 0924  TempSrc: Temporal  PainSc: 0-No pain                 Prentice Murphy

## 2024-05-16 LAB — SURGICAL PATHOLOGY

## 2024-05-17 ENCOUNTER — Ambulatory Visit: Payer: Self-pay | Admitting: Gastroenterology

## 2024-05-28 DIAGNOSIS — K900 Celiac disease: Secondary | ICD-10-CM

## 2024-05-28 HISTORY — DX: Celiac disease: K90.0

## 2024-05-28 NOTE — Progress Notes (Signed)
 Kelly JONELLE Brooklyn, MD [facility], DHIP [address]  [place], [place] [address]  Main: [phone] Fax:  [phone] Pager: [phone]   Gastroenterology Consultation  Referring Provider:     Self Primary Care Physician:  [name], NP Primary Gastroenterologist:  Dr. Corinn JONELLE Woods Reason for Consultation: Celiac disease        HPI:   Kelly Woods is a 48 y.o. female referred by [name], NP  for consultation & management of approximately 33-month history of symptoms of postprandial abdominal bloating associated with urgency and soft, which she nonbloody bowel movements after each meal, pelvic pressure.  She also reports early satiety.  She is very active, runs every day, generally has semiformed bowel movements in the morning.  She eats very healthy, works at [employer].  She is trying to maintain her weight by eating small frequent meal even though she has early satiety.  She denies any specific food triggers.  She had history of large ovarian mass, underwent surgery last year.  For further evaluation of symptoms, she had a follow-up with oncology revealed no recurrence of disease based on most recent imaging including PET scan.  No evidence of anemia including iron deficiency.  TSH normal.  Follow-up visit 05/28/2024 [name] is here to discuss about new diagnosis of celiac disease.  Since last visit, her workup revealed elevated celiac serologies including positive endomysial antibodies, significantly elevated serum TTG IgA levels with normal serum IgA levels.  Subsequently, she underwent upper endoscopy for further evaluation of abnormal celiac serologies, which revealed chronic peptic duodenitis only.  H. pylori breath test was negative.  Given that the histology findings were not consistent with celiac disease, she underwent HLA DQ testing which came back positive for DQ 2 and DQ 8.  She has been following strict gluten-free diet since elevated celiac  serologies.  She reports feeling significantly better, able to run without any restriction, unable to feel her body for physical activity adequately. She is also taking omeprazole 20 mg daily for last 5 to 6 days  Does not smoke or drink alcohol Denies any change in medications or recent use of antibiotics or recent illness She denies any family history of autoimmune conditions, IBD or celiac disease  NSAIDs: None  Antiplts/Anticoagulants/Anti thrombotics: None  GI Procedures:  Upper endoscopy 05/15/2024 - Normal duodenal bulb. Biopsied. - Normal stomach. Biopsied. - Normal gastroesophageal junction and esophagus. - Scalloped mucosa was found in the duodenum, rule out celiac disease. Biopsied. FINAL DIAGNOSIS        1. Duodenum (2nd Portion), cbx :       DUODENAL MUCOSA WITH FOVEOLAR METAPLASIA CONSISTENT WITH CHRONIC PEPTIC       DUODENITIS.       NEGATIVE FOR DYSPLASIA OR MALIGNANCY.        2. Duodenum, Biopsy, cbx :       GASTRIC ANTRAL / OXYNTIC MUCOSA WITH FOCAL MILD CHRONIC INACTIVE GASTRITIS.       NO H. PYLORI IDENTIFIED ON H&E STAIN.       NEGATIVE FOR INTESTINAL METAPLASIA OR DYSPLASIA.       NO DUODENAL MUCOSA IDENTIFIED.        3. Stomach, biopsy, cbx random :       DUODENAL MUCOSA WITH FOVEOLAR METAPLASIA AND BRUNNER GLAND HYPERPLASIA       CONSISTENT WITH CHRONIC PEPTIC DUODENITIS.       NEGATIVE FOR DYSPLASIA OR MALIGNANCY.       NO GASTRIC MUCOSA IDENTIFIED.  Screening colonoscopy 10/09/2023 The perianal and digital rectal examinations were normal. Pertinent  negatives include normal sphincter tone and no palpable rectal lesions. The entire examined colon appeared normal. The retroflexed view of the distal rectum and anal verge was normal and  showed no anal or rectal abnormalities. Impression: - The entire examined colon is normal. - The distal rectum and anal verge are normal on  retroflexion view. - No specimens collected.   Past Medical History:   Diagnosis Date  . Abnormal Pap smear 2007 or 2008   LEEP procedure done  . Anemia 10/31/2008  . Epilepsy (CMS/HHS-HCC)    History as a child - resolved   . PONV (postoperative nausea and vomiting)     Past Surgical History:  Procedure Laterality Date  . ROBOT ASSISTED LAPAROSCOPIS SURGERY WITH REMOVAL OF ADNEXAL STRUCTURES (PARTIAL/TOTAL OOPHORECTOMY AND/OR SALPINGECTOMY Right 09/07/2023   Procedure: *ROBOT* ASSISTED LAPAROSCOPY, SURGICAL; WITH REMOVAL OF ADNEXAL STRUCTURES (PARTIAL/TOTAL OOPHORECTOMY AND/OR SALPINGECTOMY;  Surgeon: Elby Webb Loges, MD;  Location: [facility] OR;  Service: Gynecology;  Laterality: Right;  . ROBOT ASSITED LAPAROSCOPIC DIAGNOSTIC; ABDOMEN, PERITONEUM, AND OMENTUM N/A 09/07/2023   Procedure: ROBOT ASSISTED LAPAROSCOPY,ABDOMEN,PERITONEUM,AND OMENTUM,DIAGNOSTIC,WITH OR WITHOUT COLLECTION OF SPECIMEN(S) BY BRUSHING OR WASHING (SEPARATE PROCEDURE);  Surgeon: Elby Webb Loges, MD;  Location: [facility] OR;  Service: Gynecology;  Laterality: N/A;  . PELVIC EXAMINATION UNDER ANESTHESIA N/A 09/07/2023   Procedure: PELVIC EXAMINATION UNDER ANESTHESIA (OTHER THAN LOCAL);  Surgeon: Elby Webb Loges, MD;  Location: [facility] OR;  Service: Gynecology;  Laterality: N/A;  . APPENDECTOMY N/A 09/07/2023   Procedure: APPENDECTOMY;  Surgeon: Elby Webb Loges, MD;  Location: [facility] OR;  Service: Gynecology;  Laterality: N/A;  . COLONOSCOPY  10/09/2023   10 year repeat  . CERVICAL BIOPSY  W/ LOOP ELECTRODE EXCISION    . HYSTERECTOMY       Current Outpatient Medications:  .  estradioL  (CLIMARA ) 0.075 mg/24 hr patch, Place 0.075 mg onto the skin, Disp: , Rfl:  .  multivitamin tablet, Take 1 tablet by mouth once daily, Disp: , Rfl:  .  omeprazole (PRILOSEC OTC) 20 MG EC tablet, Take 20 mg by mouth once daily, Disp: , Rfl:    Family History  Problem Relation Name Age of Onset  . Thyroid disease Mother    . Thyroid disease Maternal Aunt    .  Thyroid disease Maternal Grandmother       Social History   Tobacco Use  . Smoking status: Never  . Smokeless tobacco: Never  Vaping Use  . Vaping status: Never Used  Substance Use Topics  . Alcohol use: Not Currently    Alcohol/week: 2.0 standard drinks of alcohol    Types: 2 Cans of beer per week  . Drug use: No    Allergies as of 05/28/2024  . (No Known Allergies)    Review of Systems:    All systems reviewed and negative except where noted in HPI.   Physical Exam:  BP 106/66   Pulse 62   Temp 36.2 C (97.1 F) (Oral)   Ht 167.6 cm (5' 6)   Wt 57.8 kg (127 lb 6.4 oz)   LMP 03/22/2013   BMI 20.56 kg/m  Patient's last menstrual period was 03/22/2013.  General:   Alert,  Well-developed, well-nourished, pleasant and cooperative in NAD Eyes:  Sclera clear, no icterus.   Conjunctiva pink. Lungs:  Respirations even and unlabored.  Clear throughout to auscultation.   No wheezes, crackles, or rhonchi. No acute  distress. Heart:  Regular rate and rhythm; no murmurs, clicks, rubs, or gallops. Abdomen:  Normal bowel sounds. Soft, non-tender and non-distended without masses, hepatosplenomegaly or hernias noted.  No guarding or rebound tenderness.   Rectal: Not performed Extremities:  No clubbing or edema.  No cyanosis. Neurologic:  Alert and oriented x3;  grossly normal neurologically. Skin:  Intact without significant lesions or rashes. No jaundice. Psych:  Alert and cooperative. Normal mood and affect.  Imaging Studies: Reviewed  Assessment and Plan:   Brea Coleson is a 48 y.o. 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  Colonoscopy in 10/2023 was  unremarkable.  Positive celiac serologies for endomysial antibodies as well as TTG IgA.  However, upper endoscopy after 2 weeks on gluten-free diet did not reveal histologic evidence of celiac disease other than chronic peptic duodenitis.  HLA-DQ2 and DQ 8 were positive Based on the above information, I am convinced that she has early onset of celiac disease Recommend to continue strict gluten-free diet, discussed about gluten-free diet information Check celiac serologies in 6 months   Follow up in 6 months   Kelly JONELLE Brooklyn, MD Assessment & Plan

## 2024-06-12 ENCOUNTER — Inpatient Hospital Stay

## 2024-06-12 ENCOUNTER — Inpatient Hospital Stay: Attending: Obstetrics and Gynecology | Admitting: Obstetrics and Gynecology

## 2024-06-12 ENCOUNTER — Other Ambulatory Visit: Payer: Self-pay

## 2024-06-12 VITALS — BP 102/71 | HR 56 | Temp 97.6°F | Resp 20 | Wt 128.2 lb

## 2024-06-12 DIAGNOSIS — R970 Elevated carcinoembryonic antigen [CEA]: Secondary | ICD-10-CM | POA: Insufficient documentation

## 2024-06-12 DIAGNOSIS — C569 Malignant neoplasm of unspecified ovary: Secondary | ICD-10-CM

## 2024-06-12 DIAGNOSIS — Z90722 Acquired absence of ovaries, bilateral: Secondary | ICD-10-CM | POA: Diagnosis not present

## 2024-06-12 DIAGNOSIS — Z90710 Acquired absence of both cervix and uterus: Secondary | ICD-10-CM | POA: Diagnosis not present

## 2024-06-12 DIAGNOSIS — R978 Other abnormal tumor markers: Secondary | ICD-10-CM | POA: Diagnosis not present

## 2024-06-12 DIAGNOSIS — Z79890 Hormone replacement therapy (postmenopausal): Secondary | ICD-10-CM | POA: Insufficient documentation

## 2024-06-12 DIAGNOSIS — Z8543 Personal history of malignant neoplasm of ovary: Secondary | ICD-10-CM | POA: Diagnosis not present

## 2024-06-12 NOTE — Progress Notes (Signed)
 Gynecologic Oncology Interval Note  Referring Provider: Dr Maribeth  Chief Concern: Ovarian Mucinous Borderline Tumor with microinvasion Subjective:  Kelly Woods is a 48 y.o. female who is seen in consultation from Dr. 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   In May was seen for concerns of increased bloating, early satiety, and pelvic pressure.  Symptoms new in the past couple of weeks. She has tried adjusting her diet and reducing physical activity without improvement in her symptoms. She continues hormonal therapy for surgical menopause. No N/V.  No new life stressors. Due to her symptoms, imaging and tumor markers were performed:   PET scan 5/25 and repeat CEA, CA125 and CA19-9 all normal 04/08/24.  05/28/24 Gastroenterology She was seen in consultation for 3 months history of abdominal bloating, worse postprandial, early satiety, change in stool consistency. Colonoscopy in 10/2023 was unremarkable. Positive celiac serologies for endomysial antibodies as well as TTG IgA. However, upper endoscopy after 2 weeks on gluten-free diet did not reveal histologic evidence of celiac disease other than chronic peptic duodenitis. HLA-DQ2 and DQ 8 were positive. Based on the above information, I am convinced that she has early onset of celiac disease. Recommend to continue strict gluten-free diet, discussed about gluten-free diet information. Check celiac serologies in 6 months   03/01/24- CT C/A/P w/ contrast CT CHEST FINDINGS - Cardiovascular: No significant vascular findings. Normal heart size. No pericardial effusion. - Mediastinum/Nodes: No enlarged mediastinal, hilar, or axillary lymph nodes. Thyroid gland, trachea, and esophagus demonstrate no significant findings. -  Lungs/Pleura: Biapical pleuroparenchymal scarring. No suspicious pulmonary nodules or masses. - Musculoskeletal: No aggressive lytic or blastic lesion of bone.   CT ABDOMEN PELVIS FINDINGS - Hepatobiliary: No suspicious hepatic lesion. Gallbladder is unremarkable. No biliary ductal dilation. - Pancreas: No pancreatic ductal dilation or evidence of acute inflammation. - Spleen: No splenomegaly. - Adrenals/Urinary Tract: Bilateral adrenal glands appear normal. No hydronephrosis. Kidneys demonstrate symmetric enhancement. Urinary bladder is unremarkable for degree of distension. - Stomach/Bowel: Stomach is within normal limits. Appendix appears normal. No evidence of bowel wall thickening, distention, or inflammatory changes. Colonic stool burden compatible with constipation. - Vascular/Lymphatic: No significant vascular findings are present. No enlarged abdominal or pelvic lymph nodes. - Reproductive: Uterus is surgically absent without suspicious nodularity along the vaginal cuff. - Other: In the posterior pelvis adjacent to the rectum and loops of small bowel there is a small amount of loculated fluid/soft tissue measuring 2.1 x 1.0 cm on image 108/2 - Musculoskeletal: No aggressive lytic or blastic lesion of bone.   IMPRESSION: 1. In the posterior pelvis adjacent to the rectum and loops of small bowel there is a small amount of loculated fluid/soft tissue measuring 2.1 x 1.0 cm, nonspecific but in the setting of prior ovarian cancer is suspicious for disease involvement. Suggest more definitive characterization by pelvic MRI with and without contrast.  2. No evidence of metastatic disease in the chest. 3. Colonic stool burden compatible with constipation.  PET scan 03/15/24  IMPRESSION: 1. No signs of tracer avid recurrent or metastatic disease. 2. The recently characterized small loculated fluid/low-density soft tissue in the right paramidline presacral soft tissue is suboptimally visualized  on today's study and may have resolved. There is no corresponding increased hypermetabolism within this area on the PET-CT images.  CA 125 05/24/23 23.2 08/15/23 5.3  01/10/24 4.5 03/01/24  5.8  CEA 05/24/23 7.7 (elevated) 08/15/23 1.3 01/10/24 1.4 03/01/24  1.3  CA 19-9 05/24/23 113 (elevated) 08/15/23 5 01/10/24 5 03/01/24  6  Colonoscopy- 10/09/23 with Dr Unk. Colon was normal. Rectum and anal verge were normal. No specimens collected. Repeat 09/2033.   Mammogram- 03/31/23- Bi-Rads Category 1: negative.  Gynecologic Oncology History  Saw Dr Maribeth 05/11/23. Patient reports abdominal pain starting 05/13/2023. Felt a twisty feeling in her lower abdomen at bedtime and was quite uncomfortable overnight. No abdominal surgeries in the past per her report.  UA microscopic blood, culture negative.  Referral to urology placed.   ASCUS PAP, HPV negative 2024.  Mirena  IUD in place, no menses.   NSVD x 2  CT scan 05/16/23 FINDINGS: Reproductive: IUD present in the fundal endometrial cavity. Large multicystic mass in the central and posterior pelvis, which appears to arise from the left ovary or adnexa, measuring at least 11.6 x 8.5 x 8.5 cm (series 2, image 68, series 4, image 80).   Pelvic US  05/17/23 FINDINGS: right ovary Measurements: 3.7 x 1.9 x 2.1 cm = volume: 7.72 mL. Small hypoechoic structure with diffuse internal echoes within the right ovary measures 1.6 x 1.0 x 1.5 cm.   Left ovary Measurements: 13.4 x 6.8 x 9.7 cm = volume: 460.6 mL. There is a large complex multiloculated cystic mass within the left ovary which measures 7.9 x 6.9 x 9.1 cm.    06/21/2023 underwent an Exam under anesthesia, Robotic assisted total hysterectomy, unilateral oophorectomy  and bilateral salpingectomy with Drs. Leonce and Secord at [facility].   Findings: Left adnexal mass measuring ~ 15 cm and adherent to the pelvic floor on the left with one adhesion. Normal tubes and right ovary. The upper abdomen was normal  including omentum, appendix, bowel, liver, stomach, and diaphragmatic surfaces.  Tumor ruptured and spillage of clear fluid was aspirated with suction. Her operative pathology was benign. Her final pathology was consistent with a borderline mucinous tumor with microinvasion and washing concerning for metastatic cancer.Washings were obtained prior to rupture.  At the time of her surgery her appendix was noted to be normal.   Pathology  Diagnosis 1. 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 - FALLOPIAN TUBE WITH FIMBRIATED END; NEGATIVE FOR MALIGNANCY. 2. Uterus and cervix, Right tube - CERVIX WITH NABOTHIAN CYSTS. - ENDOMETRIUM WITH DECIDUALIZED STROMA CONSISTENT WITH PROGESTIN EFFECT. - UNREMARKABLE MYOMETRIUM. - RIGHT FALLOPIAN TUBE WITH FIMBRIATED END AND PARATUBAL CYST. - NEGATIVE FOR MALIGNANCY. 3. Adhesions, Left pelvic - FIBROUS TISSUE WITH VASCULAR CONGESTION AND REACTIVE MESOTHELIUM. - NEGATIVE FOR MALIGNANCY. Microscopic Comment 1. CASE SUMMARY: (OVARY or FALLOPIAN TUBE or PRIMARY PERITONEUM) Standard(s): AJCC-UICC 8, FIGO Cancer Report 2021 SPECIMEN Procedure: Total hysterectomy, bilateral salpingectomy, left oophorectomy Specimen Integrity: Left ovary integrity: Capsule ruptured TUMOR Tumor Site: Left ovary Tumor Size: Greatest dimension 15.2 cm Histologic Type: Mucinous borderline tumor with microinvasion Histologic Grade: Not applicable Ovarian Surface Involvement: Not applicable Fallopian Tube Surface Involvement: Not applicable Implants: Not applicable Other Tissue/ Organ Involvement: Not identified Largest Extrapelvic Peritoneal Focus: Not applicable  Peritoneal/Ascitic Fluid Involvement: Malignant cells present  Her pathology was reviewed at [facility] and we discussed her case at tumor board. [facility] review: A.  Outside consult, [phone], [facility], [place], [place].  Date of procedure 06/21/23,  designated as:  1.Ovary and fallopian tube, left: Ovary: Mucinous borderline tumor with microinvasion. Fallopian tube: No significant histopathologic abnormality.   B.  Outside consult,  [id number], Same as above.  Date of procedure 06/21/23, designated as:  Pelvic wash: Atypical/Inconclusive. Rare atypical cells present. See comment.  Tumor board options: discuss oophorectomy on the right ovary versus observation with CEA and pelvic ultrasounds. She opted to proceed with surgery.   On 09/07/2023 she underwent exam under anesthesia, diagnostic laparoscopy, pelvic washings, peritoneal biopsies, partial omentectomy, right oophorectomy, and appendectomy (staging for mucinous neoplasm) with Dr. Elby at [facility].   Pathology  A.  Peritoneum, anterior cul-de-sac, biopsy:   No evidence of malignancy. B.  Peritoneum, right pelvic, biopsy:   No evidence of malignancy. C.  Peritoneum, posterior cul-de-sac, biopsy:   No evidence of malignancy. D.  Peritoneum, left pelvic, biopsy:   No evidence of malignancy. E.  Vaginal cuff, biopsy:   No evidence of malignancy. F.  Iliac vein, left pelvic, biopsy:   No evidence of malignancy. G.  Peritoneum, left pericolic gutter, biopsy:   No evidence of malignancy.  H.  Peritoneum, right pericolic gutter, biopsy:   No evidence of malignancy. I.  Omentum, omentectomy:   No evidence of malignancy.  J.  Right ovary, oophorectomy:   No evidence of malignancy.  K.  Appendix, appendectomy: Appendix with fibrous obliteration of lumen. No evidence of malignancy.  Pelvic Washing Diagnostic Interpretation A NEGATIVE. NO EVIDENCE OF MALIGNANCY.   Postoperatively she called with concerns regarding the appearance of her umbilicus.   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)   Past Medical History:  Diagnosis Date   Anemia 1998   Bradycardia    Cancer (HCC) 2024   Ovarian    History of chicken pox    History of iron deficiency anemia    History of seizure    as a child   IUD (intrauterine device) in place    Ovarian mass, left    PVC's (premature ventricular contractions)    pt feels pvc's every night once she lies down   Raynaud phenomenon    Seizures (HCC)    childhood epilepsy    Past Surgical History:  Procedure Laterality Date   ABDOMINAL HYSTERECTOMY  2024   APPENDECTOMY  2024   COLONOSCOPY WITH PROPOFOL  N/A 10/09/2023   Procedure: COLONOSCOPY WITH PROPOFOL ;  Surgeon: Unk Corinn Skiff, MD;  Location: [facility] ENDOSCOPY;  Service: Gastroenterology;  Laterality: N/A;   COLPOSCOPY  2007   ESOPHAGOGASTRODUODENOSCOPY N/A 05/15/2024   Procedure: EGD (ESOPHAGOGASTRODUODENOSCOPY);  Surgeon: Unk Corinn Skiff, MD;  Location: [facility] ENDOSCOPY;  Service: Gastroenterology;  Laterality: N/A;   OOPHORECTOMY Right 09/07/2023   Exam under anesthesia, diagnostic laparoscopy, pelvic washings, peritoneal biopsies, partial omentectomy, right oophorectomy, and appendectomy (staging for mucinous neoplasm)   ROBOTIC ASSISTED TOTAL HYSTERECTOMY WITH BILATERAL SALPINGO OOPHERECTOMY N/A 06/21/2023   Procedure: XI ROBOTIC ASSISTED TOTAL HYSTERECTOMY WITH Bilateral SALPINGECTOMY, and left oophorectomy;  Surgeon: Elby Webb Loges, MD;  Location: [facility] ORS;  Service: Gynecology;  Laterality: N/A;   OB History     Gravida  2   Para  2   Term  2   Preterm      AB      Living  2      SAB      IAB      Ectopic      Multiple      Live Births  2          Family History  Problem Relation Age of Onset   Psoriasis Mother    Sleep apnea  Mother    Hyperlipidemia Father    Bladder Cancer Father    Diabetes Brother    Heart disease Maternal Grandfather        by-pass surgery   Cancer Paternal Grandfather    Arthritis Maternal Uncle    Diabetes Paternal Aunt    Breast cancer Paternal Aunt        56's?   Cancer Paternal Aunt    No Known  Allergies  Current Outpatient Medications on File Prior to Visit  Medication Sig Dispense Refill   estradiol  (CLIMARA  - DOSED IN MG/24 HR) 0.075 mg/24hr patch Place 1 patch (0.075 mg total) onto the skin once a week. 12 patch 3   Estradiol  0.75 MG/0.75GM GEL Place 0.75 g onto the skin daily as needed (for vasomotor symptoms associated with menopause). 90 each 2   Multiple Vitamins-Minerals (MULTIVITAMIN ADULT PO) Take 1 tablet by mouth daily at 6 (six) AM.     No current facility-administered medications on file prior to visit.   Review of Systems General:  no complaints Skin: no complaints Eyes: no complaints HEENT: no complaints Breasts: no complaints Pulmonary: no complaints Cardiac: no complaints Gastrointestinal: see interval history Genitourinary/Sexual: no complaints Ob/Gyn: no complaints Musculoskeletal: no complaints Hematology: no complaints Neurologic/Psych: no complaints  Objective:   Today's Vitals   06/12/24 0839  BP: 102/71  Pulse: (!) 56  Resp: 20  Temp: 97.6 F (36.4 C)  SpO2: 100%  Weight: 128 lb 3.2 oz (58.2 kg)     Body mass index is 20.69 kg/m.    GENERAL: Patient is a well appearing female in no acute distress HEENT:  Sclera clear. Anicteric NODES:  Negative axillary, supraclavicular, inguinal lymph node survery LUNGS:  Clear to auscultation bilaterally.   HEART:  Regular rate and rhythm.  ABDOMEN:  Soft, nontender.  No hernias, incisions well healed. No masses or ascites EXTREMITIES:  No peripheral edema. Atraumatic. No cyanosis SKIN:  Clear with no obvious rashes or skin changes.  NEURO:  Nonfocal. Well oriented.  Appropriate affect.  Pelvic: Exam chaperoned by CMA.  EGBUS: no lesions Vagina: no lesions, discharge, or bleeding.   Cervix, Uterus: surgically absent BME: smooth, no palpable masses RV: deferred   Lab Review Per HPI  Radiology Review Per hpi    Assessment:  Kelly Woods is a 48 y.o. P2 female with stage IC3  mucinous borderline tumor.  Underwent Robotic assisted total hysterectomy, unilateral oophorectomy  and bilateral salpingectomy for 11.5 cm left ovarian mass in 8/24. Pathology showed mucinous borderline tumor with microinvasion and positive washings prior to tumor rupture.  Path review at [facility] in agreement with borderline tumor with microinvasion, but washings atypical/inconclusive. In 11/24 had completion RSO, appendectomy, and biopsies negative for malignancy and negative washings. No adjuvant therapy recommended by tumor board. NED.  In 5/25 increased bloating and pelvic pressure recently.  CT scan shows In the posterior pelvis adjacent to the rectum and loops of small bowel there is a small amount of loculated fluid/soft tissue measuring 2.1 x 1.0 cm, nonspecific but in the setting of prior ovarian cancer is suspicious for disease involvement. CA125, CEA and CA-19-9 remain stable in the normal range.  PET scan was done and was negative. Seen by Gastroenterology and diagnosed with gluten intolerance.  Symptoms have resolved on gluten free diet.   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  Plan:   1. Mucinous borderline malignancy with  microinvasion and atypical washings.  Will check tumor markers today. Will plan to see her back in 3 months with repeat exam and tumor markers.    She will continue to see her pcp for health and wellness and other cancer screenings. Will follow up with GI regarding gluten intolerance.   2. Surgical Menopause- On HRT with estrogen patch (0.075%). She also has gel for episodes when patch may not be feasible. She has some ongoing hormonal/menopausal symptoms but they are mild and she prefers to hold off on dose adjustments for now. No issues with intercourse currently. Using lubricants.   3. Genetics- No pathogenic variants identified on the [employer]  Multi-Cancer+RNA panel. VUS in AXIN2 called c.1445C>T identified. The report date is 10/16/2023.   The patient's diagnosis, an outline of the further diagnostic and laboratory studies which will be required, the recommendation, and alternatives were discussed.  All questions were answered to the patient's satisfaction.   Prentice Agent, MD  CC:  Leron Glance, NP

## 2024-06-13 LAB — CA 125: Cancer Antigen (CA) 125: 5.9 U/mL (ref 0.0–38.1)

## 2024-06-13 LAB — CEA: CEA: 1.5 ng/mL (ref 0.0–4.7)

## 2024-06-13 LAB — CANCER ANTIGEN 19-9: CA 19-9: 6 U/mL (ref 0–35)

## 2024-09-11 ENCOUNTER — Inpatient Hospital Stay: Admitting: Obstetrics and Gynecology

## 2024-09-11 ENCOUNTER — Inpatient Hospital Stay: Attending: Obstetrics and Gynecology

## 2024-09-11 VITALS — BP 107/69 | HR 47 | Resp 20 | Ht 66.0 in | Wt 128.5 lb

## 2024-09-11 DIAGNOSIS — C569 Malignant neoplasm of unspecified ovary: Secondary | ICD-10-CM

## 2024-09-11 DIAGNOSIS — Z90710 Acquired absence of both cervix and uterus: Secondary | ICD-10-CM | POA: Insufficient documentation

## 2024-09-11 DIAGNOSIS — R970 Elevated carcinoembryonic antigen [CEA]: Secondary | ICD-10-CM | POA: Insufficient documentation

## 2024-09-11 DIAGNOSIS — R001 Bradycardia, unspecified: Secondary | ICD-10-CM | POA: Insufficient documentation

## 2024-09-11 DIAGNOSIS — E8940 Asymptomatic postprocedural ovarian failure: Secondary | ICD-10-CM | POA: Diagnosis not present

## 2024-09-11 DIAGNOSIS — Z8543 Personal history of malignant neoplasm of ovary: Secondary | ICD-10-CM | POA: Diagnosis present

## 2024-09-11 DIAGNOSIS — Z79890 Hormone replacement therapy (postmenopausal): Secondary | ICD-10-CM | POA: Diagnosis not present

## 2024-09-11 DIAGNOSIS — Z9079 Acquired absence of other genital organ(s): Secondary | ICD-10-CM | POA: Diagnosis not present

## 2024-09-11 DIAGNOSIS — R978 Other abnormal tumor markers: Secondary | ICD-10-CM | POA: Insufficient documentation

## 2024-09-11 DIAGNOSIS — Z90722 Acquired absence of ovaries, bilateral: Secondary | ICD-10-CM | POA: Diagnosis not present

## 2024-09-11 NOTE — Progress Notes (Signed)
 Gynecologic Oncology Interval Note  Referring Provider: Dr Maribeth  Chief Concern: Ovarian Mucinous Borderline Tumor with microinvasion Subjective:  Kelly Woods is a 48 y.o. female who is seen in consultation from Dr. 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   No new complaints.  Abdominal symptoms led to diagnosis of celiac disease and this is improved with gluten free diet. Taking HRT and no menopausal symptoms.    CA 125 05/24/23 23.2 08/15/23 5.3 01/10/24 4.5 03/01/24  5.8 06/12/24 5.9  CEA 05/24/23 7.7 (elevated) 08/15/23 1.3 01/10/24 1.4 03/01/24  1.3 06/12/24 1.5  CA 19-9 05/24/23 113 (elevated) 08/15/23 5 01/10/24 5 03/01/24  6 06/12/24 6   Mammogram- 6/25- Bi-Rads Category 1: negative.  Gynecologic Oncology History  Saw Dr Maribeth 05/11/23. Patient reports abdominal pain starting 05/13/2023. Felt a twisty feeling in her lower abdomen at bedtime and was quite uncomfortable overnight. No abdominal surgeries in the past per her report.  UA microscopic blood, culture negative.  Referral to urology placed.   ASCUS PAP, HPV negative 2024.  Mirena  IUD in place, no menses.   NSVD x 2  CT scan 05/16/23 FINDINGS: Reproductive: IUD present in the fundal endometrial cavity. Large multicystic mass in the central and posterior pelvis, which appears to arise from the left ovary or adnexa, measuring at least 11.6 x 8.5 x 8.5 cm (series 2, image 68, series 4, image 80).   Pelvic US  05/17/23 FINDINGS: right ovary Measurements: 3.7 x 1.9 x 2.1 cm = volume: 7.72 mL. Small hypoechoic structure with diffuse internal echoes within the right ovary measures 1.6 x 1.0 x 1.5 cm.   Left ovary Measurements: 13.4 x 6.8 x 9.7 cm = volume: 460.6 mL. There is a large complex  multiloculated cystic mass within the left ovary which measures 7.9 x 6.9 x 9.1 cm.    06/21/2023 underwent an Exam under anesthesia, Robotic assisted total hysterectomy, unilateral oophorectomy  and bilateral salpingectomy with Drs. Leonce and Secord at [facility].   Findings: Left adnexal mass measuring ~ 15 cm and adherent to the pelvic floor on the left with one adhesion. Normal tubes and right ovary. The upper abdomen was normal including omentum, appendix, bowel, liver, stomach, and diaphragmatic surfaces.  Tumor ruptured and spillage of clear fluid was aspirated with suction. Her operative pathology was benign. Her final pathology was consistent with a borderline mucinous tumor with microinvasion and washing concerning for metastatic cancer.Washings were obtained prior to rupture.  At the time of her surgery her appendix was noted to be normal.   Pathology  Diagnosis 1. 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 - FALLOPIAN TUBE WITH FIMBRIATED END; NEGATIVE FOR MALIGNANCY. 2. Uterus and cervix, Right tube - CERVIX WITH NABOTHIAN CYSTS. - ENDOMETRIUM WITH DECIDUALIZED STROMA CONSISTENT WITH PROGESTIN EFFECT. - UNREMARKABLE MYOMETRIUM. - RIGHT FALLOPIAN TUBE WITH FIMBRIATED END AND PARATUBAL CYST. - NEGATIVE FOR MALIGNANCY. 3. Adhesions, Left pelvic - FIBROUS TISSUE WITH VASCULAR CONGESTION AND REACTIVE MESOTHELIUM. - NEGATIVE FOR MALIGNANCY. Microscopic Comment 1. CASE SUMMARY: (OVARY or FALLOPIAN TUBE or PRIMARY PERITONEUM) Standard(s): AJCC-UICC 8, FIGO Cancer Report 2021 SPECIMEN Procedure: Total hysterectomy, bilateral salpingectomy, left oophorectomy Specimen Integrity: Left ovary integrity: Capsule ruptured TUMOR Tumor Site: Left ovary Tumor Size: Greatest dimension 15.2 cm Histologic Type: Mucinous borderline tumor with microinvasion Histologic Grade:  Not applicable Ovarian Surface Involvement: Not applicable Fallopian Tube  Surface Involvement: Not applicable Implants: Not applicable Other Tissue/ Organ Involvement: Not identified Largest Extrapelvic Peritoneal Focus: Not applicable  Peritoneal/Ascitic Fluid Involvement: Malignant cells present  Her pathology was reviewed at [facility] and we discussed her case at tumor board. [facility] review: A.  Outside consult, [id number], [facility], [place], [place].  Date of procedure 06/21/23, designated as:  1.Ovary and fallopian tube, left: Ovary: Mucinous borderline tumor with microinvasion. Fallopian tube: No significant histopathologic abnormality.   B.  Outside consult, [id number], Same as above.  Date of procedure 06/21/23, designated as:  Pelvic wash: Atypical/Inconclusive. Rare atypical cells present. See comment.  Tumor board options: discuss oophorectomy on the right ovary versus observation with CEA and pelvic ultrasounds. She opted to proceed with surgery.   On 09/07/2023 she underwent exam under anesthesia, diagnostic laparoscopy, pelvic washings, peritoneal biopsies, partial omentectomy, right oophorectomy, and appendectomy (staging for mucinous neoplasm) with Dr. Elby at [facility].   Pathology  A.  Peritoneum, anterior cul-de-sac, biopsy:   No evidence of malignancy. B.  Peritoneum, right pelvic, biopsy:   No evidence of malignancy. C.  Peritoneum, posterior cul-de-sac, biopsy:   No evidence of malignancy. D.  Peritoneum, left pelvic, biopsy:   No evidence of malignancy. E.  Vaginal cuff, biopsy:   No evidence of malignancy. F.  Iliac vein, left pelvic, biopsy:   No evidence of malignancy. G.  Peritoneum, left pericolic gutter, biopsy:   No evidence of malignancy.  H.  Peritoneum, right pericolic gutter, biopsy:   No evidence of malignancy. I.  Omentum, omentectomy:   No evidence of malignancy.  J.  Right ovary, oophorectomy:   No evidence of malignancy.  K.  Appendix, appendectomy: Appendix with fibrous obliteration of lumen. No  evidence of malignancy.  Pelvic Washing Diagnostic Interpretation A NEGATIVE. NO EVIDENCE OF MALIGNANCY.   Postoperatively she called with concerns regarding the appearance of her umbilicus.   Colonoscopy- 10/09/23 with Dr Unk. Colon was normal. Rectum and anal verge were normal. No specimens collected. Repeat 09/2033.   In 5/25 was seen for concerns of increased bloating, early satiety, and pelvic pressure.  Symptoms new in the past couple of weeks. She has tried adjusting her diet and reducing physical activity without improvement in her symptoms. She continues hormonal therapy for surgical menopause. No N/V.  No new life stressors. Due to her symptoms, imaging and tumor markers were performed:   PET scan 5/25 and repeat CEA, CA125 and CA19-9 all normal 04/08/24.  05/28/24 Gastroenterology She was seen in consultation for 3 months history of abdominal bloating, worse postprandial, early satiety, change in stool consistency. Colonoscopy in 10/2023 was unremarkable. Positive celiac serologies for endomysial antibodies as well as TTG IgA. However, upper endoscopy after 2 weeks on gluten-free diet did not reveal histologic evidence of celiac disease other than chronic peptic duodenitis. HLA-DQ2 and DQ 8 were positive. Based on the above information, I am convinced that she has early onset of celiac disease. Recommend to continue strict gluten-free diet, discussed about gluten-free diet information. Check celiac serologies in 6 months   03/01/24- CT C/A/P w/ contrast CT CHEST FINDINGS - Cardiovascular: No significant vascular findings. Normal heart size. No pericardial effusion. - Mediastinum/Nodes: No enlarged mediastinal, hilar, or axillary lymph nodes. Thyroid gland, trachea, and esophagus demonstrate no significant findings. - Lungs/Pleura: Biapical pleuroparenchymal scarring. No suspicious pulmonary nodules or masses. - Musculoskeletal: No aggressive lytic or blastic lesion of bone.   CT ABDOMEN  PELVIS  FINDINGS - Hepatobiliary: No suspicious hepatic lesion. Gallbladder is unremarkable. No biliary ductal dilation. - Pancreas: No pancreatic ductal dilation or evidence of acute inflammation. - Spleen: No splenomegaly. - Adrenals/Urinary Tract: Bilateral adrenal glands appear normal. No hydronephrosis. Kidneys demonstrate symmetric enhancement. Urinary bladder is unremarkable for degree of distension. - Stomach/Bowel: Stomach is within normal limits. Appendix appears normal. No evidence of bowel wall thickening, distention, or inflammatory changes. Colonic stool burden compatible with constipation. - Vascular/Lymphatic: No significant vascular findings are present. No enlarged abdominal or pelvic lymph nodes. - Reproductive: Uterus is surgically absent without suspicious nodularity along the vaginal cuff. - Other: In the posterior pelvis adjacent to the rectum and loops of small bowel there is a small amount of loculated fluid/soft tissue measuring 2.1 x 1.0 cm on image 108/2 - Musculoskeletal: No aggressive lytic or blastic lesion of bone.   IMPRESSION: 1. In the posterior pelvis adjacent to the rectum and loops of small bowel there is a small amount of loculated fluid/soft tissue measuring 2.1 x 1.0 cm, nonspecific but in the setting of prior ovarian cancer is suspicious for disease involvement. Suggest more definitive characterization by pelvic MRI with and without contrast.  2. No evidence of metastatic disease in the chest. 3. Colonic stool burden compatible with constipation.  PET scan 03/15/24  IMPRESSION: 1. No signs of tracer avid recurrent or metastatic disease. 2. The recently characterized small loculated fluid/low-density soft tissue in the right paramidline presacral soft tissue is suboptimally visualized on today's study and may have resolved. There is no corresponding increased hypermetabolism within this area on the PET-CT images.   Genetics:  Germline: 10/16/23-  [facility] Multi-cancer + RNA Panel: 70 genes analyzed: VUS of AXIN2 c.1445C>T ([id number])   Past Medical History:  Diagnosis Date   Anemia 1998   Bradycardia    Cancer (HCC) 2024   Ovarian   Celiac disease 05/28/2024   History of chicken pox    History of iron deficiency anemia    History of seizure    as a child   IUD (intrauterine device) in place    Ovarian mass, left    PVC's (premature ventricular contractions)    pt feels pvc's every night once she lies down   Raynaud phenomenon    Seizures (HCC)    childhood epilepsy    Past Surgical History:  Procedure Laterality Date   ABDOMINAL HYSTERECTOMY  2024   APPENDECTOMY  2024   COLONOSCOPY WITH PROPOFOL  N/A 10/09/2023   Procedure: COLONOSCOPY WITH PROPOFOL ;  Surgeon: Unk Corinn Skiff, MD;  Location: [facility] ENDOSCOPY;  Service: Gastroenterology;  Laterality: N/A;   COLPOSCOPY  2007   ESOPHAGOGASTRODUODENOSCOPY N/A 05/15/2024   Procedure: EGD (ESOPHAGOGASTRODUODENOSCOPY);  Surgeon: Unk Corinn Skiff, MD;  Location: [facility] ENDOSCOPY;  Service: Gastroenterology;  Laterality: N/A;   OOPHORECTOMY Right 09/07/2023   Exam under anesthesia, diagnostic laparoscopy, pelvic washings, peritoneal biopsies, partial omentectomy, right oophorectomy, and appendectomy (staging for mucinous neoplasm)   ROBOTIC ASSISTED TOTAL HYSTERECTOMY WITH BILATERAL SALPINGO OOPHERECTOMY N/A 06/21/2023   Procedure: XI ROBOTIC ASSISTED TOTAL HYSTERECTOMY WITH Bilateral SALPINGECTOMY, and left oophorectomy;  Surgeon: Elby Webb Loges, MD;  Location: [facility] ORS;  Service: Gynecology;  Laterality: N/A;   OB History     Gravida  2   Para  2   Term  2   Preterm      AB      Living  2      SAB      IAB  Ectopic      Multiple      Live Births  2          Family History  Problem Relation Age of Onset   Psoriasis Mother    Sleep apnea Mother    Hyperlipidemia Father    Bladder Cancer Father    Diabetes Brother    Heart disease  Maternal Grandfather        by-pass surgery   Cancer Paternal Grandfather    Arthritis Maternal Uncle    Diabetes Paternal Aunt    Breast cancer Paternal Aunt        49's?   Cancer Paternal Aunt    No Known Allergies  Current Outpatient Medications on File Prior to Visit  Medication Sig Dispense Refill   estradiol  (CLIMARA  - DOSED IN MG/24 HR) 0.075 mg/24hr patch Place 1 patch (0.075 mg total) onto the skin once a week. 12 patch 3   Estradiol  0.75 MG/0.75GM GEL Place 0.75 g onto the skin daily as needed (for vasomotor symptoms associated with menopause). 90 each 2   Multiple Vitamins-Minerals (MULTIVITAMIN ADULT PO) Take 1 tablet by mouth daily at 6 (six) AM.     No current facility-administered medications on file prior to visit.   Review of Systems General:  no complaints Skin: no complaints Eyes: no complaints HEENT: no complaints Breasts: no complaints Pulmonary: no complaints Cardiac: no complaints Gastrointestinal: see interval history Genitourinary/Sexual: no complaints Ob/Gyn: no complaints Musculoskeletal: no complaints Hematology: no complaints Neurologic/Psych: no complaints  Objective:   Today's Vitals   09/11/24 0808  BP: 107/69  Pulse: (!) 47  Resp: 20  SpO2: 100%  Weight: 128 lb 8 oz (58.3 kg)  Height: 5' 6 (1.676 m)     Body mass index is 20.74 kg/m.    GENERAL: Patient is a well appearing female in no acute distress HEENT:  Sclera clear. Anicteric NODES:  Negative axillary, supraclavicular, inguinal lymph node survery LUNGS:  Clear to auscultation bilaterally.   HEART:  Regular rate and rhythm.  ABDOMEN:  Soft, nontender.  No hernias, incisions well healed. No masses or ascites EXTREMITIES:  No peripheral edema. Atraumatic. No cyanosis SKIN:  Clear with no obvious rashes or skin changes.  NEURO:  Nonfocal. Well oriented.  Appropriate affect.  Pelvic: Exam chaperoned by CMA.  EGBUS: no lesions Vagina: no lesions, discharge, or bleeding.    Cervix, Uterus: surgically absent BME: smooth, no palpable masses RV: deferred   Lab Review Per HPI  Radiology Review Per hpi    Assessment:  Danah Reinecke is a 48 y.o. P2 female with stage IC3 mucinous borderline tumor.  Underwent Robotic assisted total hysterectomy, unilateral oophorectomy  and bilateral salpingectomy for 11.5 cm left ovarian mass in 8/24. Pathology showed mucinous borderline tumor with microinvasion and positive washings prior to tumor rupture.  Path review at [facility] in agreement with borderline tumor with microinvasion, but washings atypical/inconclusive. In 11/24 had completion RSO, appendectomy, and biopsies negative for malignancy and negative washings. No adjuvant therapy recommended by tumor board.  In 5/25 increased bloating and pelvic pressure recently.  CT scan shows In the posterior pelvis adjacent to the rectum and loops of small bowel there is a small amount of loculated fluid/soft tissue measuring 2.1 x 1.0 cm, nonspecific but in the setting of prior ovarian cancer is suspicious for disease involvement. CA125, CEA and CA-19-9 remained stable in the normal range.  PET scan was done and was negative and decision made to continue active surveillance.  Seen by Gastroenterology and diagnosed with gluten intolerance.  Symptoms have resolved on gluten free diet.   Exam normal today.  NED.    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  Plan:   1. Mucinous borderline malignancy with microinvasion and atypical washings.  Will check tumor markers today. Will plan to see her back in 3 months with repeat exam and tumor markers.    She will continue to see her pcp for health and wellness and other cancer screenings. Will follow up with GI regarding Celiac disease with gluten intolerance.   2. Surgical Menopause- On HRT with estrogen patch (0.075%). She  also has gel for episodes when patch may not be feasible. No menopausal symptoms. Using lubricants.   3. Genetics- No pathogenic variants identified on the [employer] Multi-Cancer+RNA panel. VUS in AXIN2 called c.1445C>T identified. The report date is 10/16/2023.   The patient's diagnosis, an outline of the further diagnostic and laboratory studies which will be required, the recommendation, and alternatives were discussed.  All questions were answered to the patient's satisfaction.   Prentice Agent, MD  CC:  Leron Glance, NP

## 2024-09-12 LAB — CEA: CEA: 1.5 ng/mL (ref 0.0–4.7)

## 2024-09-12 LAB — CA 125: Cancer Antigen (CA) 125: 5.4 U/mL (ref 0.0–38.1)

## 2024-09-12 LAB — CANCER ANTIGEN 19-9: CA 19-9: 6 U/mL (ref 0–35)

## 2024-09-18 ENCOUNTER — Ambulatory Visit: Payer: Self-pay | Admitting: Obstetrics and Gynecology

## 2024-11-18 ENCOUNTER — Encounter: Payer: Self-pay | Admitting: Cardiology

## 2024-11-18 ENCOUNTER — Ambulatory Visit: Payer: Self-pay | Attending: Cardiology | Admitting: Cardiology

## 2024-11-18 VITALS — BP 110/60 | HR 46 | Ht 66.0 in | Wt 130.6 lb

## 2024-11-18 DIAGNOSIS — R001 Bradycardia, unspecified: Secondary | ICD-10-CM | POA: Diagnosis not present

## 2024-11-18 DIAGNOSIS — I493 Ventricular premature depolarization: Secondary | ICD-10-CM | POA: Diagnosis not present

## 2024-11-18 NOTE — Progress Notes (Signed)
 " Cardiology Office Note:    Date:  11/18/2024   ID:  Kelly Woods, DOB 07/26/1976, MRN [id number]  PCP:  Kelly App, NP   [facility] Providers Cardiologist:  Kelly Cave, MD     Referring MD: Kelly App, NP   Chief Complaint  Patient presents with   Follow-up    12 month follow up pt has been doing well with no complaints of chest pain, chest pressure or SOB, medication reviewed verbally with patient     History of Present Illness:    Kelly Woods is a 49 y.o. female with history of PVCs, sinus bradycardia presenting for follow-up.    Doing okay, denies dizziness, presyncope or syncope.  Recently diagnosed with celiac disease follows up with gastroenterology.  Has no cardiac concerns at this time.  Prior notes/testing  Echo 07/2021 EF 55 to 60% cardiac monitor placed 04/2021 showed occasional PVCs 2.5% burden.  Average heart rate 53 bpm.  Patient triggered events were associated with PVCs.   Past Medical History:  Diagnosis Date   Anemia 1998   Bradycardia    Cancer (HCC) 2024   Ovarian   Celiac disease 05/28/2024   History of chicken pox    History of iron deficiency anemia    History of seizure    as a child   IUD (intrauterine device) in place    Ovarian mass, left    PVC's (premature ventricular contractions)    pt feels pvc's every night once she lies down   Raynaud phenomenon    Seizures (HCC)    childhood epilepsy    Past Surgical History:  Procedure Laterality Date   ABDOMINAL HYSTERECTOMY  2024   APPENDECTOMY  2024   COLONOSCOPY WITH PROPOFOL  N/A 10/09/2023   Procedure: COLONOSCOPY WITH PROPOFOL ;  Surgeon: Kelly Corinn Skiff, MD;  Location: [facility] ENDOSCOPY;  Service: Gastroenterology;  Laterality: N/A;   COLPOSCOPY  2007   ESOPHAGOGASTRODUODENOSCOPY N/A 05/15/2024   Procedure: EGD (ESOPHAGOGASTRODUODENOSCOPY);  Surgeon: Kelly Corinn Skiff, MD;  Location: [facility] ENDOSCOPY;  Service: Gastroenterology;  Laterality: N/A;   OOPHORECTOMY  Right 09/07/2023   Exam under anesthesia, diagnostic laparoscopy, pelvic washings, peritoneal biopsies, partial omentectomy, right oophorectomy, and appendectomy (staging for mucinous neoplasm)   ROBOTIC ASSISTED TOTAL HYSTERECTOMY WITH BILATERAL SALPINGO OOPHERECTOMY N/A 06/21/2023   Procedure: XI ROBOTIC ASSISTED TOTAL HYSTERECTOMY WITH Bilateral SALPINGECTOMY, and left oophorectomy;  Surgeon: Kelly Webb Loges, MD;  Location: [facility] ORS;  Service: Gynecology;  Laterality: N/A;    Current Medications: Current Meds  Medication Sig   estradiol  (CLIMARA  - DOSED IN MG/24 HR) 0.075 mg/24hr patch Place 1 patch (0.075 mg total) onto the skin once a week.   Estradiol  0.75 MG/0.75GM GEL Place 0.75 g onto the skin daily as needed (for vasomotor symptoms associated with menopause).   Multiple Vitamins-Minerals (MULTIVITAMIN ADULT PO) Take 1 tablet by mouth daily at 6 (six) AM.     Allergies:   Patient has no known allergies.   Social History   Socioeconomic History   Marital status: Married    Spouse name: Kelly Woods)   Number of children: 2   Years of education: Bachelors degree   Highest education level: Not on file  Occupational History   Not on file  Tobacco Use   Smoking status: Never   Smokeless tobacco: Never  Vaping Use   Vaping status: Never Used  Substance and Sexual Activity   Alcohol use: Yes    Alcohol/week: 3.0 standard drinks of alcohol    Types:  1 Glasses of wine, 2 Cans of beer per week    Comment: occasional   Drug use: Never   Sexual activity: Yes    Birth control/protection: Other-see comments    Comment: Surgically menopausal  Other Topics Concern   Not on file  Social History Narrative   04/01/19   From: [place] originally    Living: with husband Kelly and 2 kids   Work: [employer]      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      Enjoys: running - distance up to 1/2 marathon, hiking, geocashing       Exercise: running   Diet: cooks at home, reasonably  well - overall doing well      Safety   Seat belts: Yes    Guns: No   Safe in relationships: Yes    Social Drivers of Health   Tobacco Use: Low Risk (11/18/2024)   Patient History    Smoking Tobacco Use: Never    Smokeless Tobacco Use: Never    Passive Exposure: Not on file  Financial Resource Strain: Low Risk  (04/25/2024)   Received from [facility]   Overall Financial Resource Strain (CARDIA)    Difficulty of Paying Living Expenses: Not hard at all  Food Insecurity: No Food Insecurity (04/25/2024)   Received from [facility]   Epic    Within the past 12 months, you worried that your food would run out before you got the money to buy more.: Never true    Within the past 12 months, the food you bought just didn't last and you didn't have money to get more.: Never true  Transportation Needs: No Transportation Needs (04/25/2024)   Received from [facility] - Transportation    In the past 12 months, has lack of transportation kept you from medical appointments or from getting medications?: No    Lack of Transportation (Non-Medical): No  Physical Activity: Not on file  Stress: Not on file  Social Connections: Not on file  Depression (PHQ2-9): Low Risk (03/29/2024)   Depression (PHQ2-9)    PHQ-2 Score: 0  Alcohol Screen: Not on file  Housing: Unknown (04/25/2024)   Received from [facility]   Epic    In the last 12 months, was there a time when you were not able to pay the mortgage or rent on time?: No    Number of Times Moved in the Last Year: Not on file    At any time in the past 12 months, were you homeless or living in a shelter (including now)?: No  Utilities: Not At Risk (04/25/2024)   Received from [facility]   Epic    In the past 12 months has the electric, gas, oil, or water  company threatened to shut off services in your home?: No  Health Literacy: Not on file      Family History: The patient's family history includes Arthritis in her maternal uncle; Bladder Cancer in her father; Breast cancer in her paternal aunt; Cancer in her paternal aunt and paternal grandfather; Diabetes in her brother and paternal aunt; Heart disease in her maternal grandfather; Hyperlipidemia in her father; Psoriasis in her mother; Sleep apnea in her mother.  ROS:   Please see the history of present illness.     All other systems reviewed and are negative.  EKGs/Labs/Other Studies Reviewed:    The following studies were reviewed today:  EKG  Interpretation Date/Time:  Monday November 18 2024 09:26:34 EST Ventricular Rate:  48 PR Interval:  154 QRS Duration:  84 QT Interval:  464 QTC Calculation: 414 R Axis:   75  Text Interpretation: Sinus bradycardia Nonspecific T wave abnormality Confirmed by Darliss Rogue ([id number]) on 11/18/2024 9:28:48 AM    Recent Labs: 03/29/2024: ALT 32; BUN 15; Creatinine, Ser 0.89; Hemoglobin 12.8; Platelets 198; Potassium 4.6; Sodium 142; TSH 3.030  Recent Lipid Panel    Component Value Date/Time   CHOL 180 03/29/2024 0851   TRIG 97 03/29/2024 0851   HDL 47 03/29/2024 0851   CHOLHDL 3.8 03/29/2024 0851   LDLCALC 115 (H) 03/29/2024 0851     Risk Assessment/Calculations:          Physical Exam:    VS:  BP 110/60 (BP Location: Left Arm, Patient Position: Sitting)   Pulse (!) 46   Ht 5' 6 (1.676 m)   Wt 130 lb 9.6 oz (59.2 kg)   SpO2 98%   BMI 21.08 kg/m     Wt Readings from Last 3 Encounters:  11/18/24 130 lb 9.6 oz (59.2 kg)  09/11/24 128 lb 8 oz (58.3 kg)  06/12/24 128 lb 3.2 oz (58.2 kg)     GEN:  Well nourished, well developed in no acute distress HEENT: Normal NECK: No JVD; No carotid bruits CARDIAC: Bradycardic, regular RESPIRATORY:  Clear to auscultation without rales, wheezing or rhonchi  ABDOMEN: Soft, non-tender, non-distended MUSCULOSKELETAL:  No edema; No deformity  SKIN: Warm and dry NEUROLOGIC:   Alert and oriented x 3 PSYCHIATRIC:  Normal affect   ASSESSMENT:    1. Bradycardia   2. PVC (premature ventricular contraction)    PLAN:    In order of problems listed above:  Sinus bradycardia, patient with history of sinus bradycardia.  Otherwise asymptomatic.  Cardiac monitor previously placed showed no high degree AV block.  Last EF normal at 55 to 60%.  Continue to monitor.  No indication for pacemaker at this time.  Echo 07/2021 showed normal EF 55 to 60%.  Follow-up yearly.     Medication Adjustments/Labs and Tests Ordered: Current medicines are reviewed at length with the patient today.  Concerns regarding medicines are outlined above.  Orders Placed This Encounter  Procedures   EKG 12-Lead     No orders of the defined types were placed in this encounter.    Patient Instructions  Medication Instructions:  Your physician recommends that you continue on your current medications as directed. Please refer to the Current Medication list given to you today.   *If you need a refill on your cardiac medications before your next appointment, please call your pharmacy*  Lab Work: No labs ordered today  If you have labs (blood work) drawn today and your tests are completely normal, you will receive your results only by: MyChart Message (if you have MyChart) OR A paper copy in the mail If you have any lab test that is abnormal or we need to change your treatment, we will call you to review the results.  Testing/Procedures: No test ordered today   Follow-Up: At [facility], you and your health needs are our priority.  As part of our continuing mission to provide you with exceptional heart care, our providers are all part of one team.  This team includes your primary Cardiologist (physician) and Advanced Practice Providers or APPs (Physician Assistants and Nurse Practitioners) who all work together to provide you with the care you need, when  you need it.  Your next  appointment:   1 year(s)  Provider:   You may see Kelly Cave, MD or one of the following Advanced Practice Providers on your designated Care Team:   Lonni Meager, NP Lesley Maffucci, PA-C Bernardino Bring, PA-C Cadence [place], PA-C Tylene Lunch, NP Barnie Hila, NP    We recommend signing up for the patient portal called MyChart.  Sign up information is provided on this After Visit Summary.  MyChart is used to connect with patients for Virtual Visits (Telemedicine).  Patients are able to view lab/test results, encounter notes, upcoming appointments, etc.  Non-urgent messages can be sent to your provider as well.   To learn more about what you can do with MyChart, go to [email or web address].               Signed, Kelly Cave, MD  11/18/2024 10:45 AM    [facility]  "

## 2024-11-18 NOTE — Patient Instructions (Signed)

## 2024-12-11 ENCOUNTER — Inpatient Hospital Stay

## 2025-04-03 ENCOUNTER — Encounter: Admitting: Nurse Practitioner
